# Patient Record
Sex: Female | Born: 1935 | Race: White | Hispanic: No | Smoking: Never smoker
Health system: Southern US, Community
[De-identification: ages and names within clinical notes are randomized; demographics above are authoritative.]

## PROBLEM LIST (undated history)

## (undated) DIAGNOSIS — I219 Acute myocardial infarction, unspecified: Secondary | ICD-10-CM

## (undated) DIAGNOSIS — H6690 Otitis media, unspecified, unspecified ear: Secondary | ICD-10-CM

## (undated) DIAGNOSIS — E871 Hypo-osmolality and hyponatremia: Secondary | ICD-10-CM

## (undated) DIAGNOSIS — R188 Other ascites: Secondary | ICD-10-CM

## (undated) DIAGNOSIS — J449 Chronic obstructive pulmonary disease, unspecified: Secondary | ICD-10-CM

## (undated) DIAGNOSIS — J040 Acute laryngitis: Secondary | ICD-10-CM

## (undated) DIAGNOSIS — I10 Essential (primary) hypertension: Secondary | ICD-10-CM

## (undated) DIAGNOSIS — R54 Age-related physical debility: Secondary | ICD-10-CM

## (undated) DIAGNOSIS — M79672 Pain in left foot: Secondary | ICD-10-CM

## (undated) DIAGNOSIS — J90 Pleural effusion, not elsewhere classified: Secondary | ICD-10-CM

## (undated) DIAGNOSIS — I2510 Atherosclerotic heart disease of native coronary artery without angina pectoris: Secondary | ICD-10-CM

## (undated) DIAGNOSIS — R0600 Dyspnea, unspecified: Secondary | ICD-10-CM

## (undated) DIAGNOSIS — E7800 Pure hypercholesterolemia, unspecified: Secondary | ICD-10-CM

## (undated) DIAGNOSIS — I259 Chronic ischemic heart disease, unspecified: Secondary | ICD-10-CM

## (undated) DIAGNOSIS — N63 Unspecified lump in unspecified breast: Secondary | ICD-10-CM

## (undated) HISTORY — PX: HERNIA REPAIR: SHX51

## (undated) HISTORY — DX: Chronic ischemic heart disease, unspecified: I25.9

## (undated) HISTORY — DX: Pain in left foot: M79.672

## (undated) HISTORY — DX: Hypo-osmolality and hyponatremia: E87.1

## (undated) HISTORY — DX: Unspecified lump in unspecified breast: N63.0

## (undated) HISTORY — DX: Atherosclerotic heart disease of native coronary artery without angina pectoris: I25.10

## (undated) HISTORY — DX: Chronic obstructive pulmonary disease, unspecified: J44.9

## (undated) HISTORY — DX: Dyspnea, unspecified: R06.00

## (undated) HISTORY — DX: Age-related physical debility: R54

## (undated) HISTORY — DX: Acute myocardial infarction, unspecified: I21.9

## (undated) HISTORY — DX: Other ascites: R18.8

## (undated) HISTORY — PX: SPLENECTOMY: SUR1306

## (undated) HISTORY — DX: Otitis media, unspecified, unspecified ear: H66.90

## (undated) HISTORY — DX: Pleural effusion, not elsewhere classified: J90

## (undated) HISTORY — DX: Acute laryngitis: J04.0

## (undated) HISTORY — DX: Pure hypercholesterolemia, unspecified: E78.00

## (undated) HISTORY — DX: Essential (primary) hypertension: I10

---

## 1998-02-17 ENCOUNTER — Other Ambulatory Visit: Admission: RE | Admit: 1998-02-17 | Discharge: 1998-02-17 | Payer: Self-pay | Admitting: *Deleted

## 1998-03-09 ENCOUNTER — Ambulatory Visit (HOSPITAL_BASED_OUTPATIENT_CLINIC_OR_DEPARTMENT_OTHER): Admission: RE | Admit: 1998-03-09 | Discharge: 1998-03-09 | Payer: Self-pay | Admitting: Surgery

## 2000-05-18 ENCOUNTER — Encounter: Payer: Self-pay | Admitting: *Deleted

## 2000-05-18 ENCOUNTER — Encounter: Admission: RE | Admit: 2000-05-18 | Discharge: 2000-05-18 | Payer: Self-pay | Admitting: *Deleted

## 2000-10-16 ENCOUNTER — Encounter: Payer: Self-pay | Admitting: Surgery

## 2000-10-18 ENCOUNTER — Inpatient Hospital Stay (HOSPITAL_COMMUNITY): Admission: EM | Admit: 2000-10-18 | Discharge: 2000-10-22 | Payer: Self-pay | Admitting: Surgery

## 2001-03-07 ENCOUNTER — Encounter: Payer: Self-pay | Admitting: Surgery

## 2001-03-07 ENCOUNTER — Encounter: Admission: RE | Admit: 2001-03-07 | Discharge: 2001-03-07 | Payer: Self-pay | Admitting: Surgery

## 2002-03-10 ENCOUNTER — Encounter: Admission: RE | Admit: 2002-03-10 | Discharge: 2002-03-10 | Payer: Self-pay | Admitting: Surgery

## 2002-03-10 ENCOUNTER — Encounter: Payer: Self-pay | Admitting: Surgery

## 2002-07-08 ENCOUNTER — Encounter: Payer: Self-pay | Admitting: Internal Medicine

## 2002-07-08 ENCOUNTER — Encounter: Admission: RE | Admit: 2002-07-08 | Discharge: 2002-07-08 | Payer: Self-pay | Admitting: Internal Medicine

## 2003-11-16 ENCOUNTER — Encounter: Admission: RE | Admit: 2003-11-16 | Discharge: 2003-11-16 | Payer: Self-pay | Admitting: Internal Medicine

## 2004-01-27 ENCOUNTER — Encounter: Admission: RE | Admit: 2004-01-27 | Discharge: 2004-01-27 | Payer: Self-pay | Admitting: Internal Medicine

## 2004-03-03 ENCOUNTER — Ambulatory Visit (HOSPITAL_COMMUNITY): Admission: RE | Admit: 2004-03-03 | Discharge: 2004-03-03 | Payer: Self-pay | Admitting: Neurology

## 2004-06-15 ENCOUNTER — Encounter: Admission: RE | Admit: 2004-06-15 | Discharge: 2004-06-15 | Payer: Self-pay | Admitting: Internal Medicine

## 2004-08-11 ENCOUNTER — Ambulatory Visit: Payer: Self-pay | Admitting: Internal Medicine

## 2004-08-11 ENCOUNTER — Inpatient Hospital Stay (HOSPITAL_COMMUNITY): Admission: EM | Admit: 2004-08-11 | Discharge: 2004-08-14 | Payer: Self-pay | Admitting: Emergency Medicine

## 2004-08-12 ENCOUNTER — Encounter: Payer: Self-pay | Admitting: Internal Medicine

## 2004-08-25 ENCOUNTER — Encounter: Admission: RE | Admit: 2004-08-25 | Discharge: 2004-08-25 | Payer: Self-pay | Admitting: Surgery

## 2005-08-16 ENCOUNTER — Ambulatory Visit (HOSPITAL_BASED_OUTPATIENT_CLINIC_OR_DEPARTMENT_OTHER): Admission: RE | Admit: 2005-08-16 | Discharge: 2005-08-16 | Payer: Self-pay | Admitting: Surgery

## 2005-08-16 ENCOUNTER — Encounter (INDEPENDENT_AMBULATORY_CARE_PROVIDER_SITE_OTHER): Payer: Self-pay | Admitting: Specialist

## 2005-11-10 ENCOUNTER — Encounter: Admission: RE | Admit: 2005-11-10 | Discharge: 2005-11-10 | Payer: Self-pay | Admitting: Internal Medicine

## 2006-10-09 DIAGNOSIS — I219 Acute myocardial infarction, unspecified: Secondary | ICD-10-CM

## 2006-10-09 HISTORY — DX: Acute myocardial infarction, unspecified: I21.9

## 2006-12-06 ENCOUNTER — Inpatient Hospital Stay (HOSPITAL_COMMUNITY): Admission: EM | Admit: 2006-12-06 | Discharge: 2006-12-18 | Payer: Self-pay | Admitting: Emergency Medicine

## 2006-12-06 ENCOUNTER — Encounter (INDEPENDENT_AMBULATORY_CARE_PROVIDER_SITE_OTHER): Payer: Self-pay | Admitting: Cardiology

## 2006-12-15 ENCOUNTER — Encounter (INDEPENDENT_AMBULATORY_CARE_PROVIDER_SITE_OTHER): Payer: Self-pay | Admitting: Specialist

## 2008-04-22 ENCOUNTER — Encounter: Admission: RE | Admit: 2008-04-22 | Discharge: 2008-04-22 | Payer: Self-pay | Admitting: Otolaryngology

## 2009-10-23 ENCOUNTER — Inpatient Hospital Stay (HOSPITAL_COMMUNITY): Admission: EM | Admit: 2009-10-23 | Discharge: 2009-10-25 | Payer: Self-pay | Admitting: Emergency Medicine

## 2010-05-23 ENCOUNTER — Ambulatory Visit: Payer: Self-pay | Admitting: Cardiology

## 2010-08-24 ENCOUNTER — Ambulatory Visit: Payer: Self-pay | Admitting: Cardiology

## 2010-12-06 ENCOUNTER — Ambulatory Visit (INDEPENDENT_AMBULATORY_CARE_PROVIDER_SITE_OTHER): Payer: Medicare Other | Admitting: Cardiology

## 2010-12-06 ENCOUNTER — Other Ambulatory Visit: Payer: Self-pay | Admitting: Cardiology

## 2010-12-06 DIAGNOSIS — E7800 Pure hypercholesterolemia, unspecified: Secondary | ICD-10-CM

## 2010-12-06 DIAGNOSIS — I119 Hypertensive heart disease without heart failure: Secondary | ICD-10-CM

## 2010-12-06 DIAGNOSIS — Z79899 Other long term (current) drug therapy: Secondary | ICD-10-CM

## 2010-12-07 ENCOUNTER — Ambulatory Visit: Payer: Self-pay | Admitting: Cardiology

## 2010-12-08 ENCOUNTER — Ambulatory Visit
Admission: RE | Admit: 2010-12-08 | Discharge: 2010-12-08 | Disposition: A | Discharge status: Home / Self Care | Payer: PRIVATE HEALTH INSURANCE | Source: Ambulatory Visit | Attending: Cardiology | Admitting: Cardiology

## 2010-12-08 ENCOUNTER — Ambulatory Visit
Admission: RE | Admit: 2010-12-08 | Discharge: 2010-12-08 | Disposition: A | Discharge status: Home / Self Care | Payer: Medicare Other | Source: Ambulatory Visit | Attending: Cardiology | Admitting: Cardiology

## 2010-12-16 ENCOUNTER — Emergency Department (HOSPITAL_COMMUNITY): Payer: Medicare Other

## 2010-12-16 ENCOUNTER — Inpatient Hospital Stay (HOSPITAL_COMMUNITY)
Admission: EM | Admit: 2010-12-16 | Discharge: 2010-12-22 | DRG: 920 | Disposition: A | Discharge status: Home Health | Payer: Medicare Other | Attending: Internal Medicine | Admitting: Internal Medicine

## 2010-12-16 DIAGNOSIS — H6690 Otitis media, unspecified, unspecified ear: Secondary | ICD-10-CM | POA: Diagnosis present

## 2010-12-16 DIAGNOSIS — E236 Other disorders of pituitary gland: Secondary | ICD-10-CM | POA: Diagnosis present

## 2010-12-16 DIAGNOSIS — M79609 Pain in unspecified limb: Secondary | ICD-10-CM | POA: Diagnosis present

## 2010-12-16 DIAGNOSIS — J90 Pleural effusion, not elsewhere classified: Secondary | ICD-10-CM | POA: Diagnosis present

## 2010-12-16 DIAGNOSIS — I252 Old myocardial infarction: Secondary | ICD-10-CM

## 2010-12-16 DIAGNOSIS — Y838 Other surgical procedures as the cause of abnormal reaction of the patient, or of later complication, without mention of misadventure at the time of the procedure: Secondary | ICD-10-CM | POA: Diagnosis present

## 2010-12-16 DIAGNOSIS — R5381 Other malaise: Secondary | ICD-10-CM | POA: Diagnosis present

## 2010-12-16 DIAGNOSIS — Z9089 Acquired absence of other organs: Secondary | ICD-10-CM

## 2010-12-16 DIAGNOSIS — IMO0002 Reserved for concepts with insufficient information to code with codable children: Principal | ICD-10-CM | POA: Diagnosis present

## 2010-12-16 DIAGNOSIS — I10 Essential (primary) hypertension: Secondary | ICD-10-CM | POA: Diagnosis present

## 2010-12-16 LAB — DIFFERENTIAL
Basophils Absolute: 0 10*3/uL (ref 0.0–0.1)
Lymphocytes Relative: 16 % (ref 12–46)
Lymphs Abs: 2.1 10*3/uL (ref 0.7–4.0)
Neutro Abs: 10.2 10*3/uL — ABNORMAL HIGH (ref 1.7–7.7)
Neutrophils Relative %: 78 % — ABNORMAL HIGH (ref 43–77)

## 2010-12-16 LAB — CBC
HCT: 37.4 % (ref 36.0–46.0)
MCV: 87.4 fL (ref 78.0–100.0)
RBC: 4.28 MIL/uL (ref 3.87–5.11)
RDW: 12.3 % (ref 11.5–15.5)
WBC: 13 10*3/uL — ABNORMAL HIGH (ref 4.0–10.5)

## 2010-12-16 LAB — COMPREHENSIVE METABOLIC PANEL
ALT: 16 U/L (ref 0–35)
Albumin: 3.2 g/dL — ABNORMAL LOW (ref 3.5–5.2)
Alkaline Phosphatase: 96 U/L (ref 39–117)
BUN: 8 mg/dL (ref 6–23)
Chloride: 87 mEq/L — ABNORMAL LOW (ref 96–112)
Glucose, Bld: 166 mg/dL — ABNORMAL HIGH (ref 70–99)
Potassium: 3.8 mEq/L (ref 3.5–5.1)
Sodium: 120 mEq/L — ABNORMAL LOW (ref 135–145)
Total Bilirubin: 0.8 mg/dL (ref 0.3–1.2)
Total Protein: 7.2 g/dL (ref 6.0–8.3)

## 2010-12-16 LAB — URINALYSIS, ROUTINE W REFLEX MICROSCOPIC
Bilirubin Urine: NEGATIVE
Glucose, UA: 100 mg/dL — AB
Ketones, ur: 40 mg/dL — AB
Protein, ur: NEGATIVE mg/dL
pH: 7.5 (ref 5.0–8.0)

## 2010-12-16 LAB — LIPASE, BLOOD: Lipase: 25 U/L (ref 11–59)

## 2010-12-16 LAB — URINE MICROSCOPIC-ADD ON

## 2010-12-17 ENCOUNTER — Inpatient Hospital Stay (HOSPITAL_COMMUNITY): Payer: Medicare Other

## 2010-12-17 LAB — COMPREHENSIVE METABOLIC PANEL
ALT: 20 U/L (ref 0–35)
AST: 24 U/L (ref 0–37)
Albumin: 2.8 g/dL — ABNORMAL LOW (ref 3.5–5.2)
CO2: 22 mEq/L (ref 19–32)
Calcium: 7.8 mg/dL — ABNORMAL LOW (ref 8.4–10.5)
Creatinine, Ser: 0.62 mg/dL (ref 0.4–1.2)
GFR calc Af Amer: >60 mL/min (ref >60)
GFR calc non Af Amer: >60 mL/min (ref >60)
Sodium: 119 mEq/L — CL (ref 135–145)
Total Protein: 6.1 g/dL (ref 6.0–8.3)

## 2010-12-17 LAB — SODIUM
Sodium: 121 mEq/L — ABNORMAL LOW (ref 135–145)
Sodium: 120 mEq/L — ABNORMAL LOW (ref 135–145)

## 2010-12-17 LAB — LIPID PANEL
Cholesterol: 155 mg/dL (ref 0–200)
Triglycerides: 95 mg/dL (ref <150)

## 2010-12-17 LAB — GLUCOSE, CAPILLARY
Glucose-Capillary: 138 mg/dL — ABNORMAL HIGH (ref 70–99)
Glucose-Capillary: 108 mg/dL — ABNORMAL HIGH (ref 70–99)
Glucose-Capillary: 127 mg/dL — ABNORMAL HIGH (ref 70–99)

## 2010-12-17 LAB — OSMOLALITY, URINE: Osmolality, Ur: 467 mOsm/kg (ref 390–1090)

## 2010-12-17 LAB — CBC
MCH: 29.1 pg (ref 26.0–34.0)
MCHC: 33.7 g/dL (ref 30.0–36.0)
Platelets: 272 10*3/uL (ref 150–400)
RDW: 12.2 % (ref 11.5–15.5)

## 2010-12-17 LAB — PROTIME-INR
INR: 1.02 (ref 0.00–1.49)
Prothrombin Time: 13.6 seconds (ref 11.6–15.2)

## 2010-12-17 LAB — VITAMIN B12: Vitamin B-12: 483 pg/mL (ref 211–911)

## 2010-12-17 LAB — BRAIN NATRIURETIC PEPTIDE: Pro B Natriuretic peptide (BNP): 416 pg/mL — ABNORMAL HIGH (ref 0.0–100.0)

## 2010-12-17 MED ORDER — IOHEXOL 300 MG/ML  SOLN
125.0000 mL | Freq: Once | INTRAMUSCULAR | Status: AC | PRN
  Administered 2010-12-17: 125 mL via INTRAVENOUS

## 2010-12-17 NOTE — H&P (Signed)
NAME:  [name], [name]               ACCOUNT NO.:  [id number]  MEDICAL RECORD NO.:  [id number]           PATIENT TYPE:  E  LOCATION:  WLED                         FACILITY:  [facility]  PHYSICIAN:  Conley Canal, MD      DATE OF BIRTH:  1916-09-07  DATE OF ADMISSION:  12/16/2010 DATE OF DISCHARGE:                             HISTORY & PHYSICAL   PRIMARY CARE PHYSICIAN:  Dr. Patty Sermons.  CHIEF COMPLAINT:  Not feeling well, weak, abdominal pain for a few days.  HISTORY OF PRESENT ILLNESS:  [name] is a 77 year old female with history of hypertension, myocardial infarction in the past, laryngitis, status post splenectomy, who comes in with complaints of feeling weak for the last few days.  She has had some cough and abdominal soreness, which she attributed to the recurrent cough.  She denied any fever.  No diarrhea.  No urinary symptoms.  In the emergency room, the patient had a CT abdomen and pelvis, which showed a large fluid collection beneath the surgical hernia mesh and also some bronchiectasis in the lower lobes.  Surgery was consulted by the emergency room, and he recommended interventional radiology guided drainage.  PAST MEDICAL HISTORY: 1. Hypertension. 2. History of myocardial infarction. 3. Pneumonia. 4. Laryngitis. 5. Splenectomy.  SOCIAL HISTORY:  Denies cigarette smoking, alcohol, or illicit drugs. The patient lives alone.  FAMILY HISTORY:  Noncontributory at this point.  ALLERGIES:  NEOSPORIN, PENICILLINS, PREDNISONE, SULFA.  HOME MEDICATIONS:  Lopressor, Lasix, aspirin, multivitamins, calcium, azithromycin, losartan, Vigamox, Mucinex.  REVIEW OF SYSTEMS:  Limited as the patient is hard of hearing, otherwise she denied any other symptomatology except as highlighted above.  PHYSICAL EXAMINATION:  GENERAL:  This is a frail elderly lady, who is accompanied by family members at bedside.  VITAL SIGNS:  Blood pressure 169/73, heart rate is 83, temperature 98,  respirations 20, oxygen saturation is 95% on room air. HEAD, EARS, NOSE, AND THROAT:  No oral thrush.  No jugular venous distention.  RESPIRATORY SYSTEM:  Bilateral air entry, scattered rhonchi.  No wheezing.  CARDIOVASCULAR SYSTEM:  First and second heart sounds heard.  No murmurs.  Pulse regular. ABDOMEN:  Slightly distended, diffusely tender to deep palpation in the epigastric region.  Bowel sounds appreciated. CNS:  The patient is hard of hearing, follows commands.  No localized deficits.  EXTREMITIES:  No pedal edema.  Peripheral pulses equal.  Labs were reviewed.  Significant for WBC 13, hemoglobin 12.6, hematocrit 37.4, platelet count 294, neutrophils 78%.  Sodium is 120, potassium 3.8, BUN 8, creatinine 0.67, glucose 167, calcium 8.6, lipase 25. Urinalysis:  Nitrites negative, leukocytes trace.  CT abdomen and pelvis was described above.  IMPRESSION:  A 77 year old female presented with some abdominal pain and has abdominal fluid collection concerning for abscess collection in view of leukocytosis and general lethargy.  She is hyponatremic, most likely related to ongoing abdominal issues as well as laryngitis.  PLAN: 1. Abdominal fluid collection, presumed abscess collection.  We will     admit the patient for IV antibiotics and arrange for interventional     radiology guided drainage, to send fluid for  Gram stain culture and     sensitivity.  Consider surgical input.  The patient will be covered     with vancomycin and clindamycin given PENICILLIN allergy. 2. Hyponatremia.  There might be element of dehydration given the     abdominal fluid collection.  We will send urine osmolality, sodium,     also serum osmolality, uric acid level, TSH, lipids panel, BNP 3. Hypertension.  Blood pressure uncontrolled.  We will resume     Lopressor, losartan.  Hold Lasix had Norvasc. 4. GI prophylaxis, PPI. 5. DVT prophylaxis, SCDs until fluid drained. 6. The patient's condition is  closely guarded.     Conley Canal, MD     SR/MEDQ  D:  12/17/2010  T:  12/17/2010  Job:  [id number]  cc:   Dr. Patty Sermons  Electronically Signed by Conley Canal  on 12/17/2010 05:08:41 AM

## 2010-12-18 LAB — BASIC METABOLIC PANEL
BUN: 6 mg/dL (ref 6–23)
CO2: 23 mEq/L (ref 19–32)
Calcium: 7.9 mg/dL — ABNORMAL LOW (ref 8.4–10.5)
Chloride: 93 mEq/L — ABNORMAL LOW (ref 96–112)
Creatinine, Ser: 0.77 mg/dL (ref 0.4–1.2)
GFR calc Af Amer: >60 mL/min (ref >60)
GFR calc non Af Amer: >60 mL/min (ref >60)
Glucose, Bld: 102 mg/dL — ABNORMAL HIGH (ref 70–99)
Potassium: 3.8 mEq/L (ref 3.5–5.1)
Sodium: 121 mEq/L — ABNORMAL LOW (ref 135–145)

## 2010-12-18 LAB — URINE CULTURE
Colony Count: >=100000
Special Requests: NEGATIVE

## 2010-12-18 LAB — GLUCOSE, CAPILLARY
Glucose-Capillary: 106 mg/dL — ABNORMAL HIGH (ref 70–99)
Glucose-Capillary: 100 mg/dL — ABNORMAL HIGH (ref 70–99)
Glucose-Capillary: 112 mg/dL — ABNORMAL HIGH (ref 70–99)

## 2010-12-19 ENCOUNTER — Inpatient Hospital Stay (HOSPITAL_COMMUNITY): Payer: Medicare Other

## 2010-12-19 LAB — CBC
HCT: 35.1 % — ABNORMAL LOW (ref 36.0–46.0)
MCHC: 33.3 g/dL (ref 30.0–36.0)
Platelets: 297 10*3/uL (ref 150–400)
RDW: 12.4 % (ref 11.5–15.5)
WBC: 12.2 10*3/uL — ABNORMAL HIGH (ref 4.0–10.5)

## 2010-12-19 LAB — BASIC METABOLIC PANEL
Calcium: 8.1 mg/dL — ABNORMAL LOW (ref 8.4–10.5)
GFR calc Af Amer: >60 mL/min (ref >60)
GFR calc non Af Amer: >60 mL/min (ref >60)
Glucose, Bld: 100 mg/dL — ABNORMAL HIGH (ref 70–99)
Potassium: 3.4 mEq/L — ABNORMAL LOW (ref 3.5–5.1)
Sodium: 126 mEq/L — ABNORMAL LOW (ref 135–145)

## 2010-12-19 LAB — GLUCOSE, CAPILLARY: Glucose-Capillary: 182 mg/dL — ABNORMAL HIGH (ref 70–99)

## 2010-12-20 ENCOUNTER — Inpatient Hospital Stay (HOSPITAL_COMMUNITY): Payer: Medicare Other

## 2010-12-20 LAB — URINE CULTURE
Colony Count: NO GROWTH
Culture  Setup Time: 201203112042
Culture: NO GROWTH
Culture: NO GROWTH
Special Requests: NEGATIVE

## 2010-12-20 LAB — GLUCOSE, CAPILLARY
Glucose-Capillary: 131 mg/dL — ABNORMAL HIGH (ref 70–99)
Glucose-Capillary: 151 mg/dL — ABNORMAL HIGH (ref 70–99)

## 2010-12-20 LAB — CBC
Hemoglobin: 11.6 g/dL — ABNORMAL LOW (ref 12.0–15.0)
MCH: 29.4 pg (ref 26.0–34.0)
MCHC: 33.1 g/dL (ref 30.0–36.0)
Platelets: 309 10*3/uL (ref 150–400)
RDW: 12.6 % (ref 11.5–15.5)

## 2010-12-20 LAB — BASIC METABOLIC PANEL
BUN: 11 mg/dL (ref 6–23)
Creatinine, Ser: 0.74 mg/dL (ref 0.4–1.2)
GFR calc non Af Amer: >60 mL/min (ref >60)
Glucose, Bld: 109 mg/dL — ABNORMAL HIGH (ref 70–99)
Potassium: 3.8 mEq/L (ref 3.5–5.1)

## 2010-12-20 LAB — PROTIME-INR: Prothrombin Time: 14.1 seconds (ref 11.6–15.2)

## 2010-12-21 ENCOUNTER — Inpatient Hospital Stay (HOSPITAL_COMMUNITY): Payer: Medicare Other

## 2010-12-21 LAB — CBC
Hemoglobin: 12 g/dL (ref 12.0–15.0)
MCH: 29.4 pg (ref 26.0–34.0)
RBC: 4.08 MIL/uL (ref 3.87–5.11)

## 2010-12-21 LAB — BASIC METABOLIC PANEL
CO2: 27 mEq/L (ref 19–32)
Chloride: 98 mEq/L (ref 96–112)
GFR calc Af Amer: >60 mL/min (ref >60)
Sodium: 131 mEq/L — ABNORMAL LOW (ref 135–145)

## 2010-12-21 LAB — GLUCOSE, CAPILLARY
Glucose-Capillary: 102 mg/dL — ABNORMAL HIGH (ref 70–99)
Glucose-Capillary: 117 mg/dL — ABNORMAL HIGH (ref 70–99)

## 2010-12-21 MED ORDER — IOHEXOL 300 MG/ML  SOLN
100.0000 mL | Freq: Once | INTRAMUSCULAR | Status: AC | PRN
  Administered 2010-12-21: 100 mL via INTRAVENOUS

## 2010-12-22 ENCOUNTER — Inpatient Hospital Stay (HOSPITAL_COMMUNITY): Payer: Medicare Other

## 2010-12-22 LAB — CBC
HCT: 37.3 % (ref 36.0–46.0)
MCH: 29.2 pg (ref 26.0–34.0)
MCHC: 33 g/dL (ref 30.0–36.0)
MCV: 88.6 fL (ref 78.0–100.0)
RDW: 13 % (ref 11.5–15.5)

## 2010-12-23 LAB — CULTURE, BLOOD (ROUTINE X 2)
Culture  Setup Time: 201203101159
Culture: NO GROWTH

## 2010-12-23 LAB — CULTURE, BLOOD (SINGLE): Culture: NO GROWTH

## 2010-12-24 LAB — DIFFERENTIAL
Basophils Absolute: 0 10*3/uL (ref 0.0–0.1)
Basophils Relative: 0 % (ref 0–1)
Lymphs Abs: 1.3 10*3/uL (ref 0.7–4.0)
Monocytes Relative: 4 % (ref 3–12)
Neutro Abs: 10.2 10*3/uL — ABNORMAL HIGH (ref 1.7–7.7)

## 2010-12-24 LAB — CBC
HCT: 42.7 % (ref 36.0–46.0)
Hemoglobin: 13.9 g/dL (ref 12.0–15.0)
MCV: 92.6 fL (ref 78.0–100.0)
Platelets: 271 10*3/uL (ref 150–400)
RDW: 13 % (ref 11.5–15.5)
WBC: 12 10*3/uL — ABNORMAL HIGH (ref 4.0–10.5)

## 2010-12-24 LAB — URINALYSIS, ROUTINE W REFLEX MICROSCOPIC
Bilirubin Urine: NEGATIVE
Glucose, UA: NEGATIVE mg/dL
pH: 8 (ref 5.0–8.0)

## 2010-12-24 LAB — BASIC METABOLIC PANEL
BUN: 10 mg/dL (ref 6–23)
Chloride: 98 mEq/L (ref 96–112)
Glucose, Bld: 127 mg/dL — ABNORMAL HIGH (ref 70–99)
Potassium: 3.9 mEq/L (ref 3.5–5.1)

## 2010-12-24 NOTE — Discharge Summary (Signed)
NAME:  [name], [name]               ACCOUNT NO.:  [id number]  MEDICAL RECORD NO.:  [id number]           PATIENT TYPE:  I  LOCATION:  1517                         [facility]ACILITY:  [facility]  PHYSICIAN:  Thad Ranger, MD       DATE OF BIRTH:  27-Jan-1916  DATE OF ADMISSION:  12/16/2010 DATE OF DISCHARGE:                              DISCHARGE SUMMARY   PRIMARY CARE PHYSICIAN:  Cassell Clement, M.D.  DISCHARGE DIAGNOSES: 1. Abdominal fluid collection, likely seroma, evaluated by     Interventional Radiology. 2. Hyponatremia, improved. 3. Hypertension. 4. Bilateral pleural effusions with  subtle infiltrate, left base. 5. Generalized deconditioning. 6. Left foot pain. 7. Left otitis media.  DISCHARGE MEDICATIONS: 1. Flonase 50 mcg nasal spray, 1 spray nasally twice daily. 2. Vicodin 5/325 mg p.o. every 6 hours as needed for pain. 3. Levofloxacin 500 mg p.o. daily for 7 days. 4. Lasix 40 mg p.o. daily until follow up with Dr. Patty Sermons, then  adjust dose. 5. Aspirin 81 mg p.o. daily. 6. Calcium 600 mg p.o. daily. 7. Losartan 100 mg p.o. q.p.m. 8. Toprol XL 25 mg p.o. q.a.m. 9. Mucinex XR 600 mg p.o. b.i.d. 10.Multivitamin 1 tablet daily. 11.Vigamox ophthalmic 0.5%, 1 drop in left eye 4 times daily.  CONSULTATIONS:  Interventional Radiology.  BRIEF HISTORY OF PRESENT ILLNESS:  At the time of admission, Ms. Kemnitz is a 77 year old female with hypertension, history of coronary disease, laryngitis, status post splenectomy, presented with feeling of weakness for the last few days.  Prior to admission, she had some cough and abdominal fullness which she had attributed to recurrent cough.  In the emergency room, the patient had a CT of abdomen and pelvis which showed a large fluid collection beneath the surgical hernia mesh and also some bronchiectasis in the lower lobes.  The patient was admitted for further workup.  For details, refer to the admission note dictated by Dr. Conley Canal on  December 17, 2010.  RADIOLOGICAL DATA:  CT of abdomen and pelvis, this acute abdominal series on December 16, 2010 showed unremarkable bowel gas pattern, interstitial prominence and mild peribronchial thickening, suspect chronic.  CT of abdomen and pelvis on  March 10 showed large fluid collection beneath the surgical hernia mesh, may represent sterile collection versus abscess, bronchiectasis in the lower lobe, splenectomy.  Chest x-ray 2-view on December 17, 2010, showed cardiomegaly with bibasilar atelectasis versus infiltrate, small effusion, emphysematous changes.  CT of head without contrast on March 12 showed chronic small vessel ischemia.  No acute intracranial abnormality, scattered paranasal sinus and left mastoid air cell, opacification, mastoid findings most commonly reflect a sterile effusion, possibility of left otitis media.  Chest x-ray 2-view March 12, some increase in the small bilateral pleural effusion, bibasilar airspace opacity, likely due to atelectasis, cardiomegaly without edema.  CT neck with contrast showed no acute or significant findings on neck.  No evidence of mucosal or submucosal lesions.  No cause of pain.  CT chest with contrast, March 14, motion degraded exam, bilateral slightly moderate-sized pleural effusion, adjacent parenchymal changes suggestive of passive atelectasis, although difficult to exclude a subtle infiltrate  within the left base.  Mildly enlarged right hilar lymph nodes, possible reactive or mild CHF, cardiomegaly, very small pericardial effusion, coronary artery calcification, calcification of the aorta, asymmetric appearance of the breast tissue with fullness on the left.  BRIEF HOSPITALIZATION COURSE:  Ms. Stiner is a 77 year old female who presented with some abdominal pain and cough and also was found to have some abdominal fluid collection concerning for abscess. 1. Abdominal fluid collection.  Surgery was consulted in the emergency      room and recommended interventional radiology evaluation.  IR     evaluated the patient and felt given the patient did not have     abdominal symptoms and most likely the fluid is a seroma and not     infected and did not recommend drainage at this time. 2. Mild dyspnea with left lung infiltrate and leukocytosis and left     posterior otitis media.  CT chest and neck was done yesterday which     did show some pleural effusion as well as subtle infiltrates within     the left base.  The patient has been on antibiotics, vancomycin and     clindamycin, day 6 today.  The patient will be discharged on     levofloxacin to complete the course. 3. Left foot pain, unclear in etiology.  The patient had off and on     left foot pain.  However, improved on ambulation and I will obtain     foot x-ray.  The patient had adamantly refused it in the last 2     days.  However, she is agreeable to do it today.  Pending the     results, the patient will be discharged home. 4. Generalized deconditioning.  Home PT/OT will be arranged at the     time of discharge as well as home RN for followup. 5. Pleural effusion.  During the hospitalization, Lasix was held and     the patient also received IV fluids and she was placed back on     Lasix and I also gave her 1 extra dose of Lasix.  She will continue     40 mg of Lasix daily until she follows up with Dr. Patty Sermons within     next 2 weeks.  She should have a BMET checked at Dr. Yevonne Pax     office.  PHYSICAL EXAMINATION ON DISCHARGE:  VITAL SIGNS:  At the time of the dictation, temperature 98.3, pulse 80, respirations 20, blood pressure 120/66, O2 sats 94% on room air. GENERAL:  The patient is alert, awake, not in any acute distress. HEENT:  Anicteric sclerae.  Conjunctivae clear.  Pupils react to light and accommodation.  EOMI. NECK:  Supple.  No lymphadenopathy. CVS:  S1 and S2 clear. CHEST:  Decreased breath sounds at the bases, otherwise fairly  clear. ABDOMEN:  Soft, nontender, nondistended.  Normal bowel sounds. EXTREMITIES:  No cyanosis, clubbing or edema in the legs.  Left foot slightly erythematous and swollen.  RECOMMENDATIONS:  Left foot x-ray pending at the time of dictation. Discharge followup with Dr. Cassell Clement within next 2 weeks. Discharge time 35 minutes.     Thad Ranger, MD     RR/MEDQ  D:  12/22/2010  T:  12/22/2010  Job:  [id number]  cc:   Cassell Clement, M.D. Fax: [phone]  Electronically Signed by Darrion Macaulay  on 12/24/2010 11:36:38 AM

## 2010-12-25 LAB — CBC
HCT: 36.1 % (ref 36.0–46.0)
Hemoglobin: 12 g/dL (ref 12.0–15.0)
MCHC: 33.1 g/dL (ref 30.0–36.0)
MCV: 91.9 fL (ref 78.0–100.0)
Platelets: 243 10*3/uL (ref 150–400)
RDW: 13.1 % (ref 11.5–15.5)

## 2010-12-25 LAB — BASIC METABOLIC PANEL
BUN: 13 mg/dL (ref 6–23)
CO2: 24 mEq/L (ref 19–32)
GFR calc non Af Amer: 57 mL/min — ABNORMAL LOW (ref >60)
Glucose, Bld: 91 mg/dL (ref 70–99)
Potassium: 3.9 mEq/L (ref 3.5–5.1)

## 2010-12-25 LAB — LIPID PANEL
Total CHOL/HDL Ratio: 3.2 RATIO
VLDL: 25 mg/dL (ref 0–40)

## 2010-12-27 LAB — CULTURE, BLOOD (ROUTINE X 2)
Culture  Setup Time: 201203141346
Culture: NO GROWTH

## 2010-12-29 ENCOUNTER — Encounter: Payer: Self-pay | Admitting: Cardiology

## 2010-12-30 ENCOUNTER — Telehealth: Payer: Self-pay | Admitting: *Deleted

## 2010-12-30 NOTE — Telephone Encounter (Signed)
[name], physical therapist with advanced left message on voice mail.  Patient refused physical therapy and daughter said mother back to her norm.

## 2011-01-05 ENCOUNTER — Encounter: Payer: Self-pay | Admitting: Cardiology

## 2011-01-05 ENCOUNTER — Ambulatory Visit (INDEPENDENT_AMBULATORY_CARE_PROVIDER_SITE_OTHER): Payer: Medicare Other | Admitting: Cardiology

## 2011-01-05 VITALS — BP 104/70 | HR 80 | Wt 141.0 lb

## 2011-01-05 DIAGNOSIS — E7800 Pure hypercholesterolemia, unspecified: Secondary | ICD-10-CM

## 2011-01-05 DIAGNOSIS — Z9089 Acquired absence of other organs: Secondary | ICD-10-CM

## 2011-01-05 DIAGNOSIS — Z9081 Acquired absence of spleen: Secondary | ICD-10-CM | POA: Insufficient documentation

## 2011-01-05 DIAGNOSIS — E871 Hypo-osmolality and hyponatremia: Secondary | ICD-10-CM

## 2011-01-05 DIAGNOSIS — I10 Essential (primary) hypertension: Secondary | ICD-10-CM

## 2011-01-05 LAB — BASIC METABOLIC PANEL
CO2: 28 mEq/L (ref 19–32)
Chloride: 99 mEq/L (ref 96–112)
Potassium: 3.9 mEq/L (ref 3.5–5.1)
Sodium: 135 mEq/L (ref 135–145)

## 2011-01-05 NOTE — Progress Notes (Signed)
History of Present Illness: This pleasant 77 year old woman is seen for a scheduled followup office visit.  She was recently hospitalized at [facility] long on 12/16/10 for weakness and abdominal pain.  There was a question as to whether she might have to have interventional radiology guided drainage of a possible abdominal abscess but this turned out to be a benign seroma and did not require intervention.  She presently is feeling well.  She's not having any chills or fever or abdominal pain.  She is still having some lingering discomfort in her left ear and has an appointment to see Dr. Haroldine Laws later today  Current Outpatient Prescriptions  Medication Sig Dispense Refill  . aspirin 81 MG tablet Take 81 mg by mouth daily.        . Calcium Carbonate-Vitamin D (CALCIUM 600 + D PO) Take 1 tablet by mouth daily.        . fluticasone (FLONASE) 50 MCG/ACT nasal spray 2 sprays by Nasal route daily.        . furosemide (LASIX) 40 MG tablet Take 20 mg by mouth every other day.        . losartan (COZAAR) 100 MG tablet Take 100 mg by mouth daily.        . metoprolol succinate (TOPROL-XL) 25 MG 24 hr tablet Take 25 mg by mouth daily.        . Multiple Vitamin (MULTIVITAMIN) tablet Take 1 tablet by mouth daily.        . nitroGLYCERIN (NITROSTAT) 0.4 MG SL tablet Place 0.4 mg under the tongue every 5 (five) minutes as needed.          Allergies  Allergen Reactions  . Lipitor (Atorvastatin Calcium)     MUSCLE CRAMPS  . Neosporin (Neomycin-Polymyx-Gramicid)   . Prednisone   . Sulfa Antibiotics   . Penicillins Rash    Patient Active Problem List  Diagnoses  . Hyponatremia  . Hypertension  . Hypercholesterolemia  . Status post splenectomy    History  Smoking status  . Never Smoker   Smokeless tobacco  . Not on file    History  Alcohol Use     No family history on file.  Review of Systems: Constitutional: no fever chills diaphoresis or fatigue or change in weight.  Head and neck: no  hearing loss, no epistaxis, no photophobia or visual disturbance. Respiratory: No cough, shortness of breath or wheezing. Cardiovascular: No chest pain peripheral edema, palpitations. Gastrointestinal: No abdominal distention, no abdominal pain, no change in bowel habits hematochezia or melena. Genitourinary: No dysuria, no frequency, no urgency, no nocturia. Musculoskeletal:No arthralgias, no back pain, no gait disturbance or myalgias. Neurological: No dizziness, no headaches, no numbness, no seizures, no syncope, no weakness, no tremors. Hematologic: No lymphadenopathy, no easy bruising. Psychiatric: No confusion, no hallucinations, no sleep disturbance.    Physical Exam: Filed Vitals:   01/05/11 1005  BP: 104/70  Pulse: 80  Her weight is 141 down 5 pounds.  This is a well-developed well-nourished elderly woman who looks younger than her stated age.Pupils equal and reactive.   Extraocular Movements are full.  There is no scleral icterus.  The mouth and pharynx are normal.  The neck is supple.  The carotids reveal no bruits.  The jugular venous pressure is normal.  The thyroid is not enlarged.  There is no lymphadenopathy.The chest is clear to percussion and auscultation. There are no rales or rhonchi. Expansion of the chest is symmetrical.The precordium is quiet.  The first  heart sound is normal.  The second heart sound is physiologically split.  There is no murmur gallop rub or click.  There is no abnormal lift or heave.The abdomen is soft and nontender. Bowel sounds are normal. The liver and spleen are not enlarged. There Are no abdominal masses. There are no bruits.The pedal pulses are good.  There is no phlebitis or edema.  There is no cyanosis or clubbing.Strength is normal and symmetrical in all extremities.  There is no lateralizing weakness.  There are no sensory deficits.   Assessment / Plan:

## 2011-01-05 NOTE — Assessment & Plan Note (Signed)
The patient was recently hospitalized on 12/16/10 before weakness and abdominal pain.  She was found to be mildly hyponatremic.  Serum sodium was 120.  Her BUN was 8 and her creatinine was 0.67.  In the hospital she had some signs of mild congestive heart failure and was treated with full dose Lasix 40 mg daily.  Her weight is now down 6 pounds and we will cut back on her furosemide to just one half tablet daily

## 2011-01-05 NOTE — Assessment & Plan Note (Signed)
Her blood pressure in the office today is satisfactory.  She denies any dizziness but she states that her head feels heavy.  She has had recent left otitis media with decreased hearing and is scheduled to see Dr. Haroldine Laws later today.

## 2011-01-06 ENCOUNTER — Telehealth: Payer: Self-pay | Admitting: *Deleted

## 2011-01-06 NOTE — Telephone Encounter (Signed)
Message copied by Regis Bill on Fri Jan 06, 2011  2:14 PM ------      Message from: Cassell Clement      Created: Thu Jan 05, 2011  5:00 PM       Serum sodium is now back up to 135 which is normal.  Continue present medication.

## 2011-01-06 NOTE — Progress Notes (Signed)
Advised patient

## 2011-01-06 NOTE — Telephone Encounter (Signed)
Home health nurse phoned, patient blood pressure 126/60, FYI.  Lab results given to patient

## 2011-01-09 ENCOUNTER — Other Ambulatory Visit: Payer: Self-pay | Admitting: Cardiology

## 2011-01-09 DIAGNOSIS — I10 Essential (primary) hypertension: Secondary | ICD-10-CM

## 2011-01-09 DIAGNOSIS — I119 Hypertensive heart disease without heart failure: Secondary | ICD-10-CM

## 2011-02-24 NOTE — Op Note (Signed)
[facility]  Patient:    [name], [name]                      MRN: [id number] Proc. Date: 10/17/00 Adm. Date:  [id number] Attending:  Katha Cabal                           Operative Report  PREOPERATIVE DIAGNOSIS:  Ventral incisional hernia.  POSTOPERATIVE DIAGNOSIS:  Ventral incisional hernia, swiss-cheese lower incisional hernia.  OPERATION:  Laparoscopic ventral hernia repair with Gore-Tex mesh.  SURGEON:  Thornton Park. Daphine Deutscher, M.D.  ASSISTANT:  Angelia Mould. Derrell Lolling, M.D.  ANESTHESIA:  General endotracheal.  DESCRIPTION OF PROCEDURE:  [name] was taken to operating room #1 on the afternoon of October 18, 1999, and given general anesthesia.  The patient was prepped wide open with Betadine and draped sterilely.  I entered on the left side and placed a Hasson cannula first and insufflated the abdomen.  We placed a 5 mm in the right upper quadrant and eventually another 10 mm in the right lower quadrant.  Using the harmonic scalpel to take down numerous adhesions that were stuck up and incarcerated in these multiple big dilated holes where the permanent suture material had torn through the fascia.  These were reduced in toto.  This left multiple holes and what we did was map out a big enough piece of mesh to cover all of these holes with the needles.  This led [place] to cut a piece of mesh about 18 x 24 trimming the edges, marking it and then placing eight stitches all around it.  This was then marked, rolled and inserted through the 10 mm site and then unrolled.  We then grasped each of the Prolene sutures sequentially and got around and found that we had actually skipped one and went back and found that and moved everything forward so that it was properly oriented, and these were pulled up to the upper anterior abdominal wall and tacked.  One of these in the right upper quadrant broke and I went ahead and passed another Novofil through and did a  simple suture, tacked that edge to the mesh.  These were all tied.  With them in place, we used the titanium tie to go around the perimeter and secure this first and then we secured the midportion as well.  She seemed to tolerate this well. There were some little bleeders when the needles were introduced but this slowed which indicates there is no evidence of active bleeding.  She had a total of eight little holes where the guide sutures were placed and three trocar sites.  The 10 mm trocar site in the right lower quadrant was closed with a single suture through the Endclose of 0 Novofil.  The patient seemed to tolerate the procedure well and was taken to the recovery room in satisfactory condition. DD:  10/17/00 TD:  10/17/00 Job: [id number]

## 2011-02-24 NOTE — Discharge Summary (Signed)
[name], [name]               ACCOUNT NO.:  [id number]   MEDICAL RECORD NO.:  [id number]          PATIENT TYPE:  INP   LOCATION:  4733                         FACILITY:  [facility]   PHYSICIAN:  Michaelyn Barter, M.D. DATE OF BIRTH:  [birth year]   DATE OF ADMISSION:  08/11/2004  DATE OF DISCHARGE:  08/14/2004                                 DISCHARGE SUMMARY   DISCHARGE DIAGNOSES:  1.  Unresponsiveness, etiology questionable.  2.  Uncontrolled hypertension.  3.  Chronic bilateral ankle pain.   HISTORY OF PRESENT ILLNESS:  The patient is an 77 year old female with a  past medical history of:  1.  Hypertension.  2.  Transient ischemic attack.  3.  Lower extremity swelling.  At the time of her presentation, the patient was accompanied by her son-in-  law, who stated that he found the patient to be unresponsive for several  minutes. At that time, the patient reported that she did not remember any of  the events of the morning. She denied having any headaches, no chest pain,  no shortness of breath, no fever or chills recently. She denies having any  difficulty swallowing or problems with her memory at that particular time,  other than for the events that led up to her unresponsiveness. She denied  feeling anything other than her baseline over the days preceding her  admission.   HOSPITAL COURSE:  UNRESPONSIVENESS:  At the time of admission, the reason  for the patient's unresponsiveness was unclear. Therefore, multiple causes  have to be ruled out, first of which was any possibility of arrhythmia. The  patient was admitted to the telemetry floor and monitored over the course of  her hospitalization. Over the course of her hospitalization, her telemetry  monitor recorded normal sinus rhythm throughout her hospitalization.  Likewise, the possibility of acute coronary syndrome had to be considered.  The patient had a troponin I completed at the time of admission, which was  recorded at  less than 0.05. Likewise, several days later, the patient also  had a troponin I repeated, which was 0.02 and completely normal. In  addition, there was a concern over the possibility of an acute intracranial  event. Therefore, MRI/MRA of the head was ordered. the final impression of  which was:  1.  Atrophy and small vessel disease.  2.  Prominent perivascular spaces, likely suggesting hypertension.  3.  Tiny acute infarction within the right insular cortex.  4.  No abnormal intracranial enhancement.  5.  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  6.  The acute infarction represents an interval change since the pervious      study.  Further details of this tiny infarction are given and the size is actually  quantified to be sub-centimeter area of acute infarction, again affecting  the insular cortex of the right, sub-centimeter implying that it is very  tiny in its appearance, so tiny that it did not seem very possible to a  counselor that unresponsiveness that the patient presented with at home. I  called neurology regarding this finding because I again, was not convinced  that this finding was significant enough to account for the overall loss of  consciousness and because the patient never displayed any symptoms of  weakness, no loss of motor or sensory sensations. I spoke with Dr. Vickey Huger,  who happened to be the neurologist on call on August 13, 2004 and likewise,  she concurred that she did not believe that an infarction that was that tiny  could account for the patient's symptoms at the time of admission.  Therefore, the way that I approached the CT finding was, I provided the  patient with aspirin only. In addition, at the time of admission, the  patient's blood pressure was significantly elevated with a recording of  213/82 while the patient was in the emergency room. This elevated blood  pressure could have  been associated with this momentary unresponsiveness  that the patient displayed just prior to her hospitalization. Therefore, her  blood pressure was treated very aggressively over the course of her  hospitalization and over the course of her hospitalization, her blood  pressure remained controlled. In addition, when the patient was admitted,  there was the concern that the patient may have experienced a pulmonary  embolism. Therefore, a CT scan of the chest with contrast was completed. The  final results of that happen to be no evidence of acute pulmonary embolism.  There was chronic obstructive pulmonary disease present with mild dependent  atelectasis also noted. A small pericardial effusion was seen and tiny  gallstones, incidentally, were noted. In addition to the MRA that was  completed, the patient also had a CT scan of the head completed at the time  of admission which revealed no acute intracranial abnormality identified.  There was some atrophy. There was some old appearing small bilateral  vasoganglia infarcts. Again, following the patient's hospital admission, the  patient appeared to be clear in her thinking throughout the course of her  hospitalization. There was no episode of unresponsiveness throughout the  course of the patient's hospitalization. The patient did appear to attribute  her unresponsiveness to recent changes in her medications that had occurred.  The patient believes that somehow, the fact that her medications had been  recently doubled and also the fact that she had had a new medication started  the day prior to her admission, these changes in her medications may have  precipitated her entry into the hospital. That is, the patient believes that  changes in her medications may have somehow triggered her unresponsiveness.  In addition to the presentation of unresponsiveness, the patient did complain of some leg pain that she has had for quite some time that is  for  at least several month and because of this, she underwent a venous duplex of  the lower extremity to rule out  the possibility of deep vein thrombosis.  The final results of the venous duplex from the right and the left lower  extremities were no evidence of deep vein thrombosis, superficial  thrombosis, or Baker's cyst. In addition, on July 12, 2004, the patient  and her daughter both explained to me that the patient had been having  bilateral lower leg/ankle pain for at least 3 to 4 months. She had explained  that she had presented these complaints to her primary care physician also  and that he was well aware of her current symptoms. Because of her  complaint, x-rays of both of her lower extremities were ordered.  The final  result of which included the left ankle, there is small bony density just  beneath the medial malleolus, which could represent either a small acute  avulsion or old trauma. The ankle joint appears normal. Degenerative  calcaneal spurs were noted. The right ankle showed no definite acute  fracture. There was irregularity of both the medial and posterior malleoli,  which appear to be due to prior trauma. There is loss of cortical margin and  demineralization of the medial aspect of the tarsonavicular and  osteomyelitis could not be excluded. To further understand the last  statement that osteomyelitis could not be excluded, I personally went down  to the radiology department and had one of the radiologist, Dr. Karin Golden  review the x-rays with me and regarding that last statement that  osteomyelitis could not be excluded, Dr. Karin Golden addressed that. He stated  that there was nothing on the x-rays to conclude that there was  osteomyelitis present. He stated that in summary, the patient's x-rays  revealed flat feet and degenerative changes.   CONDITION ON DISCHARGE:  Significantly improved.   DISPOSITION:  The patient has not experienced any symptoms of   unresponsiveness over the course of her hospitalization. She has not  complained of any headaches, no light headedness, no chest pain, and no  shortness of breath over the course of her hospitalization. Therefore, the  patient will be discharged home.   DISCHARGE MEDICATIONS:  1.  Norvasc 7.5 mg 1 tablet p.o. q.d.  2.  Aspirin 325 mg 1 tablet q.d.  3.  Evista 60 mg 1 tablet p.o. q.d.   SPECIAL INSTRUCTIONS:  The patient is instructed to take all of her  medications as prescribed. She is instructed to talk to her primary care  physician about possibly arranging for an EEG as an outpatient to followup  the CT scan results   FOLLOW UP:  To followup with her primary care physician within 1 to 2 weeks  for further adjustments in her medications.      Orla   OR/MEDQ  D:  08/13/2004  T:  08/14/2004  Job:  [id number]   cc:   Wilson Singer, M.D.  [facility]  [place]  [place] [address]  Fax: [phone]

## 2011-02-24 NOTE — Discharge Summary (Signed)
NAME:  [name], [name]               ACCOUNT NO.:  [id number]   MEDICAL RECORD NO.:  [id number]          PATIENT TYPE:  INP   LOCATION:  1439                         FACILITY:  [facility]   PHYSICIAN:  Hind I Elsaid, MD      DATE OF BIRTH:  06/24/1916   DATE OF ADMISSION:  12/05/2006  DATE OF DISCHARGE:                               DISCHARGE SUMMARY   INTERIM DISCHARGE SUMMARY:   DISCHARGE DIAGNOSES:  1. Right lower lobe pneumonia.  2. Hemoptysis, most probably due to right lower lobe pneumonia.  3. Non-ST segment elevation myocardial infarction.  4. Hyponatremia, resolved.  5. Hypertension.   DISCHARGE MEDICATIONS:  To be addressed on discharge.   CONSULTATION:  Cardiology consulted by Dr. Viann Fish for elevated  troponin.   PROCEDURE:  CT angiogram was done on December 06, 2006, which was  negative for pulmonary embolism, new air space disease along the  superior segment of the right lower lobe.  Findings are most consistent  with a focus of pneumonia, mild adenopathy and the chest has no  significant change.  CT abdomen and pelvis was negative.  I was unsure  if  small-bowel pleural effusion was bibasilar atelectasis or right  lower lobe pneumonia, a small hiatal hernia, status post splenectomy,  intravenous transformation of the portal vein, enlarging seroma also set  with anterior abdominal wall mesh.  Another x-ray was done on December 07, 2006, showed better segment right lower lobe infiltrate on CT chest.  Recommend follow-up.  Chest x-ray done on March, bilateral central and  basilar pulmonary opacities are stable.  Bibasilar atelectasis or small  pleural effusions are unchanged.  A 2-D echo was done on December 06, 2006, left ventricular ejection  fraction 50%, mild hypokinesis of the anterior septal wall.  Left  ventricular wall thickness was moderately to markedly increased.  Mild  asymmetric septal hypertrophy.  Estimated peak pulmonary artery systolic  was  mildly increased to 35.  A small pericardial effusion circling the  heart.   BRIEF HISTORY:  [name] is an 77 year old female with past medical  history of hypertension, she states she complained of cough for greater  than one week.  She has been coughing over the past several days and  actually saw her primary care physician, Dr. Karilyn Cota, yesterday, before  the day of admission.  They started her on antibiotics.  Then the day of  admission she started coughing and she began to cough up a significant  amount of blood and she states she had multiple results of coughing up  of blood.  Her symptoms persisted up until the time she came to the  emergency room.  Patient mainly admitted for coughing blood.  Hemoglobin  remained stable.   PROBLEM #1 -  COUGHING BLOOD:  A CT scan was done which showed bilateral  wall segment pneumonia.  Patient was started on antibiotics and IV  fluids and admitted to telemetry for close monitoring.  Patient, when  seen, she was coughing significant amount of bloody sputum.  Patient was  kept on close observation for her respiratory  status.  Sputum for  culture and Gram stain was done.  Also sputum for fungal infection was  collected and sputum for acid-fast bacilli was collected.  During  hospitalization, with  broad spectrum antibiotics to Zosyn and  vancomycin.  During hospitalization, amount of blood being coughed was  significantly decreased in amount.  Today, scan amount of blood was  coughed.  In my opinion, it most probably is due to right lower lobe  pneumonia but I cannot decide if there is underlying hidden malignancy  under the pneumonia.  So, will continue with  broad spectrum antibiotic.  Amount of blood being coughed is significantly decreased.  Will repeat  another CAT scan of the chest during hospitalization to see if there is  significant improvement.  If there is not anything, this is an  improvement on the chest x-ray, will consider calling  pulmonary consult  for possible bronchoscopy or VATS to evaluate the area of consolidation  or area of opacities.  White blood cells continue to remain stable,  around 15.1 despite  broad spectrum antibiotics.  Plan for this patient  to repeat another CAT scan with the possibility of calling pulmonary  consult if there is no decrease in the size of the inflammation or  consolidation as seen on the first CAT scan.   PROBLEM #2 -  MILD ELEVATION OF TROPONIN:  Cardiology was consulted by  Dr. Viann Fish.  He thinks it is most probably due to a non-ST  elevation MI and as the patient has a significant amount of hemoptysis  on the day of admission, he just started the patient on a small dose of  aspirin and a small dose of beta-blocker as the blood pressure allows as  the patient was hypotensive on the day of admission.  No further  surgical intervention at this time.  HMG coA reductase inhibitor was  also added to the medication.   PROBLEM #3 -  HYPERTENSION:  During admission, had a low blood pressure,  maybe due to the severity of the pneumonia.  Patient responded  immediately to the IV fluids with blood pressure remaining stable since  hospitalization.   PROBLEM #4 -  HYPONATREMIA:  Completely resolved with IV fluids.   PROBLEM #5 -  Patient has hemoglobin on admission of 12 and anemia.  Hemoglobin remained stable.   PROBLEM #6 -  HYPERTENSION:  We added small dose of beta-blocker to the  medication.   DISPOSITION:  Patient was able to be discharged home after improvement  of her respiratory status pending CT scan of her chest to evaluate for  improvement on the right lower lobe consolidation/opacities.  If no  significant improvement, will consider pulmonary consult.  If patient  significantly improves will possibly discharge to home and repeat a CAT  scan after six weeks.      Hind Bosie Helper, MD  Electronically Signed    HIE/MEDQ  D:  12/10/2006  T:  12/10/2006  Job:   [id number]

## 2011-02-24 NOTE — Discharge Summary (Signed)
[facility]  Patient:    [name], [name]                      MRN: [id number] Proc. Date: 10/17/00 Adm. Date:  [id number] Disc. Date: [id number] Attending:  Katha Cabal                           Discharge Summary  ADMISSION DIAGNOSIS:  Ventral hernia.  DISCHARGE DIAGNOSIS:  Ventral hernia, status post laparoscopic ventral hernia repair with Gore-Tex mesh.  HOSPITAL COURSE:  [name] was admitted on October 17, 2000, and underwent the afore mentioned procedure.  Postoperatively, she did well.  She is elderly, at being 77 years old, and it took her a while to get over this.  She began passing gas, and was begun on a diet.  She was ready for discharge on October 22, 2000.  DISCHARGE MEDICATIONS:  Tylox for pain.  FOLLOWUP:  Asked to return in two weeks.  CONDITION ON DISCHARGE:  Good. DD:  10/31/00 TD:  10/31/00 Job: [id number]

## 2011-02-24 NOTE — H&P (Signed)
NAME:  [name], [name] NO.:  [id number]   MEDICAL RECORD NO.:  [id number]          PATIENT TYPE:  EMS   LOCATION:  ED                           FACILITY:  [facility]   PHYSICIAN:  Michaelyn Barter, M.D. DATE OF BIRTH:  [birth year]   DATE OF ADMISSION:  12/06/2006  DATE OF DISCHARGE:                              HISTORY & PHYSICAL   PRIMARY CARE PHYSICIAN:  Dr. Lilly Cove.   CHIEF COMPLAINT:  Coughing up large amounts of blood.   HISTORY OF PRESENT ILLNESS:  Ms. Ke is an 77 year old female with a  past medical history of hypertension, who states that she has been  suffering from a cold for greater than 1 week.  She has been coughing  over the past several days and actually saw her primary care physician,  Dr. Karilyn Cota yesterday.  At that particular time, she was started on a  course of antibiotics, the name of which the patient cannot remember.  She was also given a cortisone injection.  She states that today at  approximately 6:30 p.m. while driving her car, she began coughing.  Her  cough continued and she began to cough up significant amounts of blood.  She would spit the blood out, only to cough up more blood later.  She  states that she had multiple episodes of coughing up blood.  Her  symptoms persisted up until the time she came to the emergency  department for evaluation.  Shortly after arriving into the ER, the  coughing up of blood ceased and she has not coughed up any more blood  since being in the ER.  She denies having any nausea, vomiting, fevers  or chills.  She states she had some chest discomfort associated with her  cough, but excluding coughing, she denied having any chest pain.  No  significant weight changes recently.  She lives alone and denies any  sick contacts.  She initially denied having any shortness of breath, but  stated that since the hemoptysis started at approximately 6:30 p.m., she  has had some slight shortness of breath.   PAST MEDICAL HISTORY:  1. Unresponsiveness during November 2005.  2. Hypertension.  3. Chronic bilateral ankle pain.  4. Questionable TIA.   ALLERGIES:  1. PENICILLIN causes a rash.  2. SULFA causes jaundice.  3. NEOSPORIN.  4. PREDNISONE.   CURRENT HOME MEDICATIONS:  The patient cannot remember the names of any  of her medications.   PAST SURGICAL HISTORY:  Cataract surgery in both eyes.   SOCIAL HISTORY:  The patient denies alcohol; she also denies cigarettes.   FAMILY HISTORY:  The mother had phlebitis.   REVIEW OF SYSTEMS:  As per HPI; otherwise, all other systems are  negative.   PHYSICAL EXAMINATION:  GENERAL:  The patient is awake and cooperative.  She reveals no obvious respiratory distress.  VITALS:  Her temperature is 97.2, blood pressure 153/67, heart rate 97,  respirations 18 and O2 SAT 95%.  HEENT:  Normocephalic, atraumatic.  Oral mucosa is pink.  No exudates.  A dental plate is in the upper region of  the mouth, partial in the lower  region of the mouth.  NECK:  No JVD.  Supple.  No lymphadenopathy.  CARDIAC:  Heart sounds are distant.  RESPIRATORY:  No crackles are wheezes.  ABDOMEN:  Soft, nontender and non-distended.  EXTREMITIES:  No leg edema.  NEUROLOGIC:  The patient is alert and oriented x3.  MUSCULOSKELETAL:  Upper and lower extremity strength 5/5.   LABORATORY AND ACCESSORY CLINICAL DATA:  White blood cell count is 17.4,  hemoglobin 12.2, hematocrit 36.6, platelets 287,000.  D-dimer 1.09.  Sodium 124, potassium 4.5, chloride 90, CO2 25, BUN 10, creatinine 0.71,  glucose 109.  CK-MB POC 9.1, 15.2 and 12.0, respectively.  Troponin I  POC 1.39, 2.29, 2.42.  Myoglobin POC 91.6, 85.7 and 111.   CT scan of the chest was completed, which revealed no PE, new airspace  disease in the superior segment of the right lower lung near the hilum,  consistent with a pneumonia, stable mild chest adenopathy.   EKGs were completed and I compared the EKG that was  completed on  December 05, 2006 to an EKG that was completed August 11, 2004 and  there is no significant change from those 2 EKGs, both of which reveal  right bundle branch block.  The EKG from December 05, 2006 reveals first-  degree AV block, but other than that, there is no significant change  from the prior EKG done August 11, 2004.   ASSESSMENT AND PLAN:  1. Hemoptysis:  This is most likely related to the pneumonia that was      shown on the patient's CAT scan.  We will initiate empiric      intravenous antibiotics with Avelox for now, especially in light of      the patient's being allergic to penicillin.  We will also check      sputum as well as blood cultures x2.  2. Elevated troponin I/CK-MB point-of-care markers:  The significant      of these markers is questionable.  Again, the patient initially      denied having any chest pain, but then went on to state that she      does have some chest discomfort associated with her cough;      therefore, the relationship to a cardiac source for the elevation      of her point-of-care markers is questionable; that is, the      significance of these elevations and the patient's markers is      questionable.  We will cycle the patient's troponin I and CK-MB x3      every 8 hours apart.  We will check a 2-D echocardiogram as well as      a fasting lipid profile.  We will also consult Cardiology in the      morning.  3. Hyponatremia:  We will provide 0.9% normal saline for now.  4. Leukocytosis:  This is most likely related to the pneumonia.  We      will again start empiric intravenous      antibiotics.  5. Hypertension:  We will start Norvasc for now.  6. Gastrointestinal prophylaxis:  We will provide Protonix.      Michaelyn Barter, M.D.  Electronically Signed     OR/MEDQ  D:  12/06/2006  T:  12/06/2006  Job:  [id number]   cc:   Wilson Singer, M.D.

## 2011-02-24 NOTE — Discharge Summary (Signed)
NAME:  [name]               ACCOUNT NO.:  [id number]   MEDICAL RECORD NO.:  [id number]          PATIENT TYPE:  INP   LOCATION:  1439                         FACILITY:  [facility]   PHYSICIAN:  Hettie Holstein, D.O.    DATE OF BIRTH:  16-Sep-1916   DATE OF ADMISSION:  12/05/2006  DATE OF DISCHARGE:                               DISCHARGE SUMMARY   ADDENDUM:   DISCHARGE MEDICATIONS:  1. Aspirin 81 mg daily.  2. Lipitor 20 mg daily with explicit instructions to Ms. Reny to      follow her liver function testing with her primary care physician.  3. Avelox 400 mg daily until December 26, 2006.  4. Diflucan 100 mg daily x5 days.  5. Lasix 40 mg daily with recommendations to follow her basic      metabolic panel on followup with her peritoneal cavity within one      week to assure that her electrolytes and renal function are stable      on this medication.  6. Cozaar 50 mg daily.  7. Lopressor 25 mg twice daily.  8. Fosamax weekly as before.  She was instructed to check her weights      daily and notify her primary physician if she experienced a weight      gain greater than 3 pounds.   BRIEF SUMMARY OF SUBSEQUENT HOSPITAL COURSE:  [name] subsequently  hospital course has been that of gradual improvement.  She was seen by  cardiology, Dr. Donnie Aho, as well as Dr. Patty Sermons, and her BNP was  followed throughout her course.  On the day of discharge, she had a BNP  of 148.  That was markedly improved from prior assessments.  Her  pneumonia clinically resolved.  Her hemoptysis, as well, resolved.  Her  white count remained in the 13-15,000 range, and again, this can be  followed by her primary care physician over the course of the next  couple of weeks.  I would recommend a chest x-ray to be performed as  well within the next couple of weeks to assure that this is stable.  Her  chest radiograph on the day of discharge revealed decreased effusions  and lower lobe densities.  Overall, her  chest x-ray was markedly  improved from previous.   DISPOSITION:  [name] is felt to be medically stable and in improved  condition.  She is to follow up with her primary care physician this  week.  I have instructed her to call for an appointment as well as her  cardiologist, Dr.  Patty Sermons, in two weeks.  Again, she is provided the numbers to call to  coordinate these appointments.  Her weight at time of discharge was 65.2  kg.  There is some discrepancy here.  The day previous, her weight was  64.6 kg, though she is negative 2300 mL on this past 24 hours.      Hettie Holstein, D.O.  Electronically Signed     ESS/MEDQ  D:  12/18/2006  T:  12/18/2006  Job:  [id number]   cc:   Johnna Acosta  Malachi Carl, MD   Wilson Singer, M.D.  Fax: [phone]   Cassell Clement, M.D.  Fax: [phone]

## 2011-02-24 NOTE — H&P (Signed)
NAME:  [name], [name]               ACCOUNT NO.:  [id number]   MEDICAL RECORD NO.:  [id number]          PATIENT TYPE:  INP   LOCATION:  4733                         FACILITY:  [facility]   PHYSICIAN:  Gertha Calkin, M.D.DATE OF BIRTH:  07/10/1916   DATE OF ADMISSION:  08/11/2004  DATE OF DISCHARGE:                                HISTORY & PHYSICAL   PRIMARY CARE PHYSICIAN:  Wilson Singer, M.D.   CHIEF COMPLAINT:  Found unresponsive this morning by family members.   HISTORY OF PRESENT ILLNESS:  This is a pleasant 77 year old female with  multiple medical problems.  She presents after being found by her family  members unresponsive to physical stimuli.  The son-in-law states that he had  to violently shake her for several minutes before she was finally  responsive.  The patient reports that she does not remember any of this this  morning.  The patient denies any headache, chest pain, shortness of breath.  No fever or chills recently. The patient denies any problems with swallowing  difficulty or memory.  The patient states that she is usually at her normal  active self and there were no symptoms present last night or days following  today's admission.   She denies any trauma, denies any syncopal episodes.  No lightheadedness or  dizziness noted.  She has been having good p.o. intake.  Does not complain  of pain in her abdomen or leg.  No headaches.  No visual changes.   PAST MEDICAL HISTORY:  1.  Hypertension.  2.  She is postmenopausal.  3.  Presumed osteoporosis (given medication list).  4.  History of TIA back in May or June of 2005.  5.  She also had a history of lower extremity swelling, notably of the left      side greater than the right, again around May or June of this year.   PAST SURGICAL HISTORY:  1.  Splenectomy.  2.  Hysterectomy.  3.  Incisional hernia repair.   MEDICATIONS:  Evista, Enalapril, and Demodex.  She is also on calcium  supplementation.   ALLERGIES:  She is allergic to PENICILLIN and SULFA which causes her to have  rash and she also states that NEOSPORIN OINTMENT causes her to blister in  that localized area.   FAMILY HISTORY:  Positive for phlebitis in her mother.  Denies hypertension,  diabetes, or coronary artery disease existing within the family.   SOCIAL HISTORY:  She stays by herself.  She is fully independent.  Denies  tobacco, alcohol, or illicit drug use.   REVIEW OF SYMPTOMS:  Positive for some memory lapses but this has been a  chronic decline over several years.  No acute changes recently.  She is  fully independent.  Does not need a walker or cane ambulation.  She stays at  home by herself.  She denied any hematochezia, melena.  No constipation or  diarrhea.  No fevers, chills, cough.  No headaches, visual changes, hearing  changes, difficulty swallowing.  She does state that earlier this year was  the first episode of her lower  extremity swelling.  She was told at that  time that it may be poison oak, poison ivy or possibly a reaction to an  insect bite.  She denies that leg having problems with pruritus.  Other  review of systems were negative.   PHYSICAL EXAMINATION:  VITAL SIGNS:  Temperature 98.6, pulse of 88,  respiration 20, blood pressure 192/69.  She is saturating 96% on room air.  GENERAL:  No acute distress.  Alert and oriented female.  HEENT:  Unremarkable.  NECK:  Without JVP.  No palpable masses.  Supple.  CARDIOVASCULAR:  Regular rate and rhythm.  No murmurs, gallops, or rubs.  CHEST:  Clear to auscultation bilaterally with good air movement.  ABDOMEN:  Soft, nontender, and nondistended.  Bowel sounds are present.  There is no rebound or guarding.  EXTREMITIES:  Bilateral symmetric appearing extremities with no swelling at  this time.  There is no tenderness and Homans' sign is negative.  No  palpable cords in either lower extremity.  PULSES:  Intact and 2+ upper and lower extremities and  symmetric.  SKIN:  Without rashes or lesions noted.  NEUROLOGICAL:  Cranial nerves II through XII intact.  There is no pronator  drift.  Muscle strength is 5/5 and symmetric throughout.  Sensation is  intact.  MUSCULOSKELETAL:  She is complaining of superficial tenderness in the medial  aspect near right above the ankle of her left leg.   LABORATORY DATA:  CT of the chest to rule out PE came back negative for PE  and noted a small pericardial effusion.  Chest x-ray was negative for any  acute infiltrative processes or cardiomegaly.  Head CT was negative for  bleed.  Pulmonary showing no chronic atrophy changes.   White count is 13.3, hemoglobin is 14.2, hematocrit of 42.3, MCV of 89,  platelets of 297,000.  Her ANC is not elevated.  Her sodium is 134,  potassium 4, chloride 103, BUN of 14, glucose of 696, bicarbonate of 23.3.  That was an I-stat.  One set of point of care cardiac markers were  completely negative.  She did have elevated d-dimer at 0.95.  UA was  negative.  BNP was 37.   ASSESSMENT/PLAN:  1.  Unresponsive:  At this time, differential includes arrhythmia which      includes ventricular tachycardia or prolonged QRS, possibly      supraventricular tachycardia.  Also worry about recurrent transient      ischemic attack.  It is also possible that she could have had an embolic      event that was transient rather than ischemic transient ischemic attack.      In addition to her workup thus far in the emergency department, we will      add a MRI and MRA to determine if she has any significant vessel      vascular disease. We will also add a 2-D echocardiogram to determine      whether she has preserved left ventricular function and rule out a      possible thrombus.  Add a CMET to her labs today and coagulases.  She is      currently obtaining Doppler ultrasounds of her legs given her history of     intermittent unilateral leg swelling.  We will need to follow up on       that.  In the morning, we will obtain routine labs, CBC, and BMET.  2.  Hypertension:  This may also be  due to hypertensive encephalopathy,      although not your classic presentation.  However, given her blood      pressure initially, systolic above 210, we will start her back on her      antihypertensive medications a half dose as we do not  want to      precipitate a cerebrovascular accident if this truly was the cause      of her presentation.  We will add medications as needed to control blood      pressure while we follow her neurologically.  3.  Osteoarthritis:  We will resume her Evista and calcium supplementation.       JD/MEDQ  D:  08/11/2004  T:  08/11/2004  Job:  [id number]   cc:   Wilson Singer, M.D.  [address]  [place]  [place] [address]  Fax: [phone]   Cassell Clement, M.D.  [address], [address]  [place]  [place] [address]  Fax: [phone]

## 2011-02-24 NOTE — Consult Note (Signed)
NAME:  [name], [name]               ACCOUNT NO.:  [id number]   MEDICAL RECORD NO.:  [id number]          PATIENT TYPE:  INP   LOCATION:  1426                         FACILITY:  [facility]   PHYSICIAN:  Georga Hacking, M.D.DATE OF BIRTH:  01/06/1916   DATE OF CONSULTATION:  12/06/2006  DATE OF DISCHARGE:                                 CONSULTATION   I was asked to see this 77 year old female this evening by Dr. Roxan Hockey.  The patient previously has been in recently good health and has been  treated with hypertension and has a history of unresponsiveness several  years ago.  She has seen Dr. Patty Sermons in the past for a stress test and  a few years ago had an echocardiogram.  She has no history of heart  attack or angina.  She still lives at home and is able to maintain her  own affairs.  She normally does not have angina.  She has had a upper  respiratory infection and cold and was treated with Zithromax.  Last  evening, she developed fairly profound hemoptysis and was brought to the  [facility] emergency room.  A CT scan showed a right lower lobe  pneumonia but no evidence of pulmonary emboli.  Symptoms persisted until  arrival to the emergency room.  She had pleuritic chest pain associated  with this but did not really have any anginal-type pain.  After she  finished coughing, she had some vague chest discomfort that was later  relieved with intravenous morphine.  Nurse's notes record a systolic  blood pressure of 70/38 at 6:00 a.m. this morning when the patient said  that she did not feel right and was pale and sweaty.  She received an  intravenous fluid bolus, and her blood pressure came up to 106/56.  She  had mild elevation of troponin in the emergency room.  Point-of-care  markers and subsequent enzymes have shown a CK-MB of 16.6 with a normal  total CPK and a troponin of 2.68.  She currently feels better.  She is  not hypoxic and is not having any significant chest discomfort.   Her past history if remarkable for hypertension.  There is also a  history of a TIA.  She has a history of unresponsive several years ago  that was evaluated.  There is a history of a hiatal hernia that has been  treated surgically in the past.   PAST SURGICAL HISTORY:  She has had repair of a hiatal hernia several  years ago.  She has a history of a partial hysterectomy, appendectomy,  tonsillectomy, and removal of a breast lump.  She has a history of  chronic bilateral ankle pain.   ALLERGIES:  PENICILLIN causes rash.  SULFA causes jaundice.  NEOSPORIN  and PREDNISONE.   CURRENT MEDICATIONS:  She is somewhat sketchy on the details but reports  that she has been on Benicar.  In addition, it is listed in the chart  that she has been taking Fosamax and Zithromax as well as Evista.   SOCIAL HISTORY:  She denies alcohol or cigarette abuse.  She  is  currently widowed.  She has seven children, a number of grandchildren  and great-grandchildren.  She currently maintains her home alone and  attends [employer].   FAMILY HISTORY:  Noncontributory.   REVIEW OF SYSTEMS:  She has had a voluntary weight loss of around 30  pounds from dieting.  She has no skin disorders or problems.  She has  had bilateral cataract extraction.  There is no history of glaucoma.  She has had hearing loss since an episode of unresponsiveness several  years ago in the left ear.  She has not had a previous history of  pneumonia.  She has no previous history of palpitations or congestive  heart failure.  She has a history of a hiatal hernia repair and  occasional constipation.  She has no history of urinary tract infection,  kidney stones, or urinary problems.  She has had chronic ankle pain  following an injury at work several years ago.  There is no history of  stroke or TIA.  Other than as noted above, the remainder of the review  of systems is unremarkable.   PHYSICAL EXAMINATION:  GENERAL:  She is  an elderly female appearing  younger than stated age.  VITAL SIGNS:  Blood pressure is currently 110/70.  Her pulse is  currently 70 and regular.  SKIN:  Warm and dry.  HEENT:  EOMI.  PERRLA.  C/S clear.  Fundi not examined.  Pharynx  negative.  NECK:  Supple without masses, JVD, thyromegaly, or bruits.  LUNGS:  Decreased breath sounds in the right lower base.  CARDIOVASCULAR:  Normal S1 and S2.  There was no S3, S4, or murmur.  ABDOMEN:  Soft and nontender.  Distal pulses are 2+.  There are mild  venous varicosities.  NEUROLOGIC:  Normal.   EKG shows sinus rhythm.  There is a right bundle branch block pattern  which is unchanged from previous ECG.   Laboratory data today shows a white count of 18,200, hemoglobin is 11.4,  hematocrit 33.3.  She was hyponatremic on admission.  Sodium is  currently 128, potassium 5, glucose 118, BUN 11, creatinine 0.89.  Troponin is 1.45.  MB is 10.4.   IMPRESSION:  1. Small subendocardial myocardial infarction, as manifested by      microelevation of cardiac enzymes.  It is likely this is due to the      stress of the pneumonia as well as hypotension that occurred      earlier this morning.  She has no documented history of cardiac      disease previously of significance.  2. Right lower lobe pneumonia with hemoptysis.  3. History of hypertension with relative hypotension earlier.  4. History of transient ischemic attack and possible transient      infusion earlier.   RECOMMENDATIONS:  She has fairly significant hemoptysis, and I would not  anticoagulate her at this time.  I would treat her with low-dose aspirin  and initiate beta blockers because of this possible small myocardial  infarction.  I would continue to treat her aggressively for pneumonia  and follow up the echocardiogram.  At this point, unless she has  recurrent symptoms at her age, I would treat conservatively with medicines unless there are further issues or problems.       Georga Hacking, M.D.  Electronically Signed     WST/MEDQ  D:  12/06/2006  T:  12/06/2006  Job:  [id number]   cc:   Nimish C.  Karilyn Cota, M.D.  Fax: [phone]   Michaelyn Barter, M.D.   Cassell Clement, M.D.  Fax: [phone]

## 2011-04-03 ENCOUNTER — Other Ambulatory Visit (INDEPENDENT_AMBULATORY_CARE_PROVIDER_SITE_OTHER): Payer: Medicare Other | Admitting: *Deleted

## 2011-04-03 ENCOUNTER — Encounter: Payer: Self-pay | Admitting: Cardiology

## 2011-04-03 ENCOUNTER — Ambulatory Visit (INDEPENDENT_AMBULATORY_CARE_PROVIDER_SITE_OTHER): Payer: Medicare Other | Admitting: Cardiology

## 2011-04-03 DIAGNOSIS — I10 Essential (primary) hypertension: Secondary | ICD-10-CM

## 2011-04-03 DIAGNOSIS — E871 Hypo-osmolality and hyponatremia: Secondary | ICD-10-CM

## 2011-04-03 DIAGNOSIS — I119 Hypertensive heart disease without heart failure: Secondary | ICD-10-CM

## 2011-04-03 DIAGNOSIS — E785 Hyperlipidemia, unspecified: Secondary | ICD-10-CM

## 2011-04-03 DIAGNOSIS — I259 Chronic ischemic heart disease, unspecified: Secondary | ICD-10-CM

## 2011-04-03 DIAGNOSIS — Z79899 Other long term (current) drug therapy: Secondary | ICD-10-CM

## 2011-04-03 LAB — HEPATIC FUNCTION PANEL
ALT: 16 U/L (ref 0–35)
AST: 23 U/L (ref 0–37)
Albumin: 4 g/dL (ref 3.5–5.2)
Alkaline Phosphatase: 72 U/L (ref 39–117)

## 2011-04-03 LAB — BASIC METABOLIC PANEL
BUN: 15 mg/dL (ref 6–23)
Chloride: 95 mEq/L — ABNORMAL LOW (ref 96–112)
GFR: 60.52 mL/min (ref >60.00)
Glucose, Bld: 93 mg/dL (ref 70–99)
Potassium: 4.4 mEq/L (ref 3.5–5.1)
Sodium: 131 mEq/L — ABNORMAL LOW (ref 135–145)

## 2011-04-03 LAB — LIPID PANEL
Cholesterol: 212 mg/dL — ABNORMAL HIGH (ref 0–200)
Total CHOL/HDL Ratio: 3
VLDL: 21 mg/dL (ref 0.0–40.0)

## 2011-04-03 LAB — LDL CHOLESTEROL, DIRECT: Direct LDL: 114.7 mg/dL

## 2011-04-03 NOTE — Assessment & Plan Note (Signed)
The patient has a long history of essential hypertension.  She has not been having any headaches but states that her head feels "funny" for the past 3 days.  She has had similar symptoms in the past.  She has seen Dr. Haroldine Laws for problems with dizziness and otitis media.  She's not having any chest pain or shortness of breath she previously had been taking half of a Lasix every other day but recently has taken a whole Lasix each day.  She has a past history of hyponatremia and the high-dose Lasix may be aggravating This.

## 2011-04-03 NOTE — Assessment & Plan Note (Signed)
We're checking electrolytes on her today

## 2011-04-03 NOTE — Progress Notes (Signed)
[name] Date of Birth:  1916/01/07 [facility] / [facility] [address]   [address] [place], [place]  [address] [phone]           Fax   [phone]  History of Present Illness: This pleasant 67 woman is seen for a scheduled followup office visit.  She has a history of a prior subendocardial myocardial infarction in 2008 normal hospitalized for acute pneumonia.  She had a subsequent outpatient nuclear stress test on 03/01/07 which showed no evidence of ischemia.  She does have a known right bundle-branch block the patient had an echocardiogram on 12/27/2006 which showed normal left ventricle systolic function with an ejection fraction of 55-60% and showed impaired relaxation and showed mild to moderate aortic stenosis with trivial aortic insufficiency.  She had mild pulmonary hypertension at 37 mm mercury.  She has not been expressing any chest pain or angina.  She does have a past history of hyponatremia.  Recently she on her own has stopped up her furosemide from 20 mg every other day to 40 mg daily.  Current Outpatient Prescriptions  Medication Sig Dispense Refill  . aspirin 81 MG tablet Take 81 mg by mouth daily.        . Calcium Carbonate-Vitamin D (CALCIUM 600 + D PO) Take 1 tablet by mouth daily.        . furosemide (LASIX) 40 MG tablet Take 20 mg by mouth every other day. Or as directed      . losartan (COZAAR) 100 MG tablet TAKE 1 TABLET EVERY DAY  30 tablet  11  . metoprolol succinate (TOPROL-XL) 25 MG 24 hr tablet Take 25 mg by mouth daily.        . Multiple Vitamin (MULTIVITAMIN) tablet Take 1 tablet by mouth daily.        . nitroGLYCERIN (NITROSTAT) 0.4 MG SL tablet Place 0.4 mg under the tongue every 5 (five) minutes as needed.        . fluticasone (FLONASE) 50 MCG/ACT nasal spray 2 sprays by Nasal route daily.          Allergies  Allergen Reactions  . Lipitor (Atorvastatin Calcium)     MUSCLE CRAMPS  . Neosporin (Neomycin-Polymyx-Gramicid)   .  Prednisone   . Sulfa Antibiotics   . Penicillins Rash    Patient Active Problem List  Diagnoses  . Hyponatremia  . Hypertension  . Hypercholesterolemia  . Status post splenectomy  . Benign hypertensive heart disease without heart failure  . Ischemic heart disease    History  Smoking status  . Never Smoker   Smokeless tobacco  . Not on file    History  Alcohol Use     No family history on file.  Review of Systems: Constitutional: no fever chills diaphoresis or fatigue or change in weight.  Head and neck: no hearing loss, no epistaxis, no photophobia or visual disturbance. Respiratory: No cough, shortness of breath or wheezing. Cardiovascular: No chest pain peripheral edema, palpitations. Gastrointestinal: No abdominal distention, no abdominal pain, no change in bowel habits hematochezia or melena. Genitourinary: No dysuria, no frequency, no urgency, no nocturia. Musculoskeletal:No arthralgias, no back pain, no gait disturbance or myalgias. Neurological: No dizziness, no headaches, no numbness, no seizures, no syncope, no weakness, no tremors. Hematologic: No lymphadenopathy, no easy bruising. Psychiatric: No confusion, no hallucinations, no sleep disturbance.    Physical Exam: Filed Vitals:   04/03/11 1007  BP: 135/76  Pulse: 80   The general appearance  feels a well-developed well-nourished elderly woman who appears to be younger than her stated age.The head and neck exam reveals pupils equal and reactive.  Extraocular movements are full.  There is no scleral icterus.  The mouth and pharynx are normal.  The neck is supple.  The carotids reveal no bruits.  The jugular venous pressure is normal.  The  thyroid is not enlarged.  There is no lymphadenopathy.  The chest is clear to percussion and auscultation.  There are no rales or rhonchi.  Expansion of the chest is symmetrical.  The precordium is quiet.  The first heart sound is normal.  The second heart sound is  physiologically split.  There is no murmur gallop rub or click.  There is no abnormal lift or heave.  The abdomen is soft and nontender.  The bowel sounds are normal.  The liver and spleen are not enlarged.  There are no abdominal masses.  There are no abdominal bruits.  Extremities reveal good pedal pulses.  There is no phlebitis or edema.  There is no cyanosis or clubbing.  Strength is normal and symmetrical in all extremities.  There is no lateralizing weakness.  There are no sensory deficits.  The skin is warm and dry.  There is no rash.  Assessment / Plan: Continue same medication except decreased furosemide to 40 mg one half tablet daily.  Blood work is pending.  Recheck in 4 months for followup office visit and lipid panel and chemistries

## 2011-04-05 ENCOUNTER — Telehealth: Payer: Self-pay | Admitting: *Deleted

## 2011-04-05 NOTE — Telephone Encounter (Signed)
Advised patient of lab work,she will call if swelling starts to get worse

## 2011-04-05 NOTE — Telephone Encounter (Signed)
Message copied by Adolphus Birchwood on Wed Apr 05, 2011 12:12 PM ------      Message from: Cassell Clement      Created: Tue Apr 04, 2011  8:49 AM       Please report.  The serum sodium is slightly low again at 131.  This could be contributing to her sense of dizziness.  This should improve now that she will be on a lower dose of furosemide one half tablet daily instead of a full tablet daily

## 2011-04-07 ENCOUNTER — Other Ambulatory Visit: Payer: Medicare Other | Admitting: *Deleted

## 2011-08-28 ENCOUNTER — Encounter: Payer: Self-pay | Admitting: Cardiology

## 2011-08-28 ENCOUNTER — Ambulatory Visit (INDEPENDENT_AMBULATORY_CARE_PROVIDER_SITE_OTHER): Payer: Medicare Other | Admitting: Cardiology

## 2011-08-28 ENCOUNTER — Other Ambulatory Visit (INDEPENDENT_AMBULATORY_CARE_PROVIDER_SITE_OTHER): Payer: Medicare Other | Admitting: *Deleted

## 2011-08-28 VITALS — BP 118/80 | HR 76 | Ht 60.0 in | Wt 147.0 lb

## 2011-08-28 DIAGNOSIS — E785 Hyperlipidemia, unspecified: Secondary | ICD-10-CM

## 2011-08-28 DIAGNOSIS — I259 Chronic ischemic heart disease, unspecified: Secondary | ICD-10-CM

## 2011-08-28 DIAGNOSIS — N390 Urinary tract infection, site not specified: Secondary | ICD-10-CM

## 2011-08-28 DIAGNOSIS — Z79899 Other long term (current) drug therapy: Secondary | ICD-10-CM

## 2011-08-28 DIAGNOSIS — I119 Hypertensive heart disease without heart failure: Secondary | ICD-10-CM

## 2011-08-28 DIAGNOSIS — I10 Essential (primary) hypertension: Secondary | ICD-10-CM

## 2011-08-28 DIAGNOSIS — E7800 Pure hypercholesterolemia, unspecified: Secondary | ICD-10-CM

## 2011-08-28 LAB — LIPID PANEL
Cholesterol: 197 mg/dL (ref 0–200)
HDL: 72.4 mg/dL (ref >39.00)
Triglycerides: 82 mg/dL (ref 0.0–149.0)
VLDL: 16.4 mg/dL (ref 0.0–40.0)

## 2011-08-28 LAB — BASIC METABOLIC PANEL
Calcium: 9 mg/dL (ref 8.4–10.5)
GFR: 65.36 mL/min (ref >60.00)
Glucose, Bld: 98 mg/dL (ref 70–99)
Potassium: 4 mEq/L (ref 3.5–5.1)
Sodium: 136 mEq/L (ref 135–145)

## 2011-08-28 LAB — HEPATIC FUNCTION PANEL
ALT: 16 U/L (ref 0–35)
AST: 24 U/L (ref 0–37)
Albumin: 3.7 g/dL (ref 3.5–5.2)
Total Bilirubin: 0.7 mg/dL (ref 0.3–1.2)
Total Protein: 7.3 g/dL (ref 6.0–8.3)

## 2011-08-28 MED ORDER — FLUTICASONE PROPIONATE 50 MCG/ACT NA SUSP: 2.0000 | Freq: Every day | NASAL | Status: AC

## 2011-08-28 NOTE — Assessment & Plan Note (Signed)
The patient denies any recent chest pain or angina

## 2011-08-28 NOTE — Patient Instructions (Signed)
Will obtain a urinalysis and call you with the results Ok to use an extra 1/2 Lasix (furosemide) as needed for increased shortness of breath or increased weight gain Your physician recommends that you schedule a follow-up appointment in: 3 months

## 2011-08-28 NOTE — Assessment & Plan Note (Signed)
The patient is careful with her diet.  She is not on any statin therapy.  At work today is pending

## 2011-08-28 NOTE — Progress Notes (Signed)
[name] Date of Birth:  Mar 01, 1916 [facility] / [facility] [address]   [address] [place], [place]  [address] [phone]           Fax   [phone]  History of Present Illness: This pleasant 53 woman is seen for a scheduled followup office visit.  She has a history of a prior subendocardial myocardial infarction in 2008 normal hospitalized for acute pneumonia.  She had a subsequent outpatient nuclear stress test on 03/01/07 which showed no evidence of ischemia.  She does have a known right bundle-branch block the patient had an echocardiogram on 12/27/2006 which showed normal left ventricle systolic function with an ejection fraction of 55-60% and showed impaired relaxation and showed mild to moderate aortic stenosis with trivial aortic insufficiency.  She had mild pulmonary hypertension at 37 mm mercury.  She has not been expressing any chest pain or angina.  She does have a past history of hyponatremia.  Recently she on her own has stopped up her furosemide from 20 mg every other day to 40 mg daily.  Since last visit she's had no new cardiac symptoms.  Current Outpatient Prescriptions  Medication Sig Dispense Refill  . aspirin 81 MG tablet Take 81 mg by mouth daily.        . Calcium Carbonate-Vitamin D (CALCIUM 600 + D PO) Take 1 tablet by mouth daily.        . fluticasone (FLONASE) 50 MCG/ACT nasal spray Place 2 sprays into the nose daily.  16 g  5  . furosemide (LASIX) 40 MG tablet Take 20 mg by mouth every other day. Or as directed      . losartan (COZAAR) 100 MG tablet TAKE 1 TABLET EVERY DAY  30 tablet  11  . metoprolol succinate (TOPROL-XL) 25 MG 24 hr tablet Take 25 mg by mouth daily.        . Multiple Vitamin (MULTIVITAMIN) tablet Take 1 tablet by mouth daily.        . nitroGLYCERIN (NITROSTAT) 0.4 MG SL tablet Place 0.4 mg under the tongue every 5 (five) minutes as needed.          Allergies  Allergen Reactions  . Lipitor (Atorvastatin Calcium)    MUSCLE CRAMPS  . Neosporin (Neomycin-Polymyx-Gramicid)   . Prednisone   . Sulfa Antibiotics   . Penicillins Rash    Patient Active Problem List  Diagnoses  . Hyponatremia  . Hypertension  . Hypercholesterolemia  . Status post splenectomy  . Benign hypertensive heart disease without heart failure  . Ischemic heart disease  . Urinary tract infection    History  Smoking status  . Never Smoker   Smokeless tobacco  . Not on file    History  Alcohol Use No    No family history on file.  Review of Systems: Constitutional: no fever chills diaphoresis or fatigue or change in weight.  Head and neck: no hearing loss, no epistaxis, no photophobia or visual disturbance. Respiratory: No cough, shortness of breath or wheezing. Cardiovascular: No chest pain peripheral edema, palpitations. Gastrointestinal: No abdominal distention, no abdominal pain, no change in bowel habits hematochezia or melena. Genitourinary: No dysuria, no frequency, no urgency, no nocturia. Musculoskeletal:No arthralgias, no back pain, no gait disturbance or myalgias. Neurological: No dizziness, no headaches, no numbness, no seizures, no syncope, no weakness, no tremors. Hematologic: No lymphadenopathy, no easy bruising. Psychiatric: No confusion, no hallucinations, no sleep disturbance.    Physical Exam: Filed Vitals:  08/28/11 0927  BP: 118/80  Pulse: 76   The general appearance feels a well-developed well-nourished elderly woman who appears to be younger than her stated age.The head and neck exam reveals pupils equal and reactive.  Extraocular movements are full.  There is no scleral icterus.  The mouth and pharynx are normal.  The neck is supple.  The carotids reveal no bruits.  The jugular venous pressure is normal.  The  thyroid is not enlarged.  There is no lymphadenopathy.  The chest is clear to percussion and auscultation.  There are no rales or rhonchi.  Expansion of the chest is symmetrical.  The  precordium is quiet.  The first heart sound is normal.  The second heart sound is physiologically split.  There is no  gallop rub or click.  There is a grade 1/6 systolic ejection murmur at the base There is no abnormal lift or heave.  The abdomen is soft and nontender.  The bowel sounds are normal.  The liver and spleen are not enlarged.  There are no abdominal masses.  There are no abdominal bruits.  Extremities reveal good pedal pulses.  There is no phlebitis.  There is 1+ ankle edema.  There is no cyanosis or clubbing.  Strength is normal and symmetrical in all extremities.  There is no lateralizing weakness.  There are no sensory deficits.  The skin is warm and dry.  There is no rash.  Assessment / Plan: Continue same medication.  Recheck in 3 months for followup office visit and basal metabolic panel.  Await results of urinalysis.

## 2011-08-28 NOTE — Assessment & Plan Note (Signed)
Her blood pressure has been remaining normal on current therapy.  She does not check her blood pressure at home and he has been having mild pedal edema 1+.  She does not have any orthopnea and she sleeps on one pillow.

## 2011-08-28 NOTE — Assessment & Plan Note (Signed)
The patient is having urinary frequency and urgency and thinks she may have another bladder infection.  We are sending her to the lab for a urinalysis with reflex culture and sensitivities.

## 2011-08-29 ENCOUNTER — Telehealth: Payer: Self-pay | Admitting: *Deleted

## 2011-08-29 LAB — URINALYSIS W MICROSCOPIC + REFLEX CULTURE
Crystals: NONE SEEN
Ketones, ur: NEGATIVE mg/dL
Leukocytes, UA: NEGATIVE
Nitrite: NEGATIVE
Specific Gravity, Urine: 1.025 (ref 1.005–1.030)
Urobilinogen, UA: 0.2 mg/dL (ref 0.0–1.0)

## 2011-08-29 NOTE — Telephone Encounter (Signed)
Message copied by Burnell Blanks on Tue Aug 29, 2011  1:21 PM ------      Message from: Cassell Clement      Created: Mon Aug 28, 2011  9:03 PM       Please report.  The labs are stable.  Continue same meds.  Continue careful diet.

## 2011-08-29 NOTE — Telephone Encounter (Signed)
Advised of results

## 2011-08-30 ENCOUNTER — Telehealth: Payer: Self-pay | Admitting: *Deleted

## 2011-08-30 NOTE — Progress Notes (Signed)
Left message

## 2011-08-30 NOTE — Telephone Encounter (Signed)
Message copied by Burnell Blanks on Wed Aug 30, 2011  2:49 PM ------      Message from: Cassell Clement      Created: Wed Aug 30, 2011  8:25 AM       Please report.  The urinalysis was normal.  No sign of infection this time.  Continue to drink plenty of water

## 2011-09-25 ENCOUNTER — Other Ambulatory Visit: Payer: Self-pay | Admitting: *Deleted

## 2011-09-25 MED ORDER — FUROSEMIDE 40 MG PO TABS: 20.0000 mg | ORAL_TABLET | ORAL | Status: DC

## 2011-09-25 NOTE — Telephone Encounter (Signed)
Refilled furosemide

## 2011-09-29 ENCOUNTER — Other Ambulatory Visit: Payer: Self-pay | Admitting: *Deleted

## 2011-09-29 MED ORDER — METOPROLOL SUCCINATE ER 25 MG PO TB24: 25.0000 mg | ORAL_TABLET | Freq: Every day | ORAL | Status: AC

## 2011-09-29 NOTE — Telephone Encounter (Signed)
Refilled metoprolol 

## 2011-10-06 ENCOUNTER — Other Ambulatory Visit: Payer: Self-pay | Admitting: *Deleted

## 2011-10-10 IMAGING — CR DG CHEST 2V
2 series · 2 of 2 positions shown · non-contrast
Comparison: PA and lateral chest 12/17/2010.

CLINICAL DATA: Chest pain.

CHEST - 2 VIEW

[w chest lat]
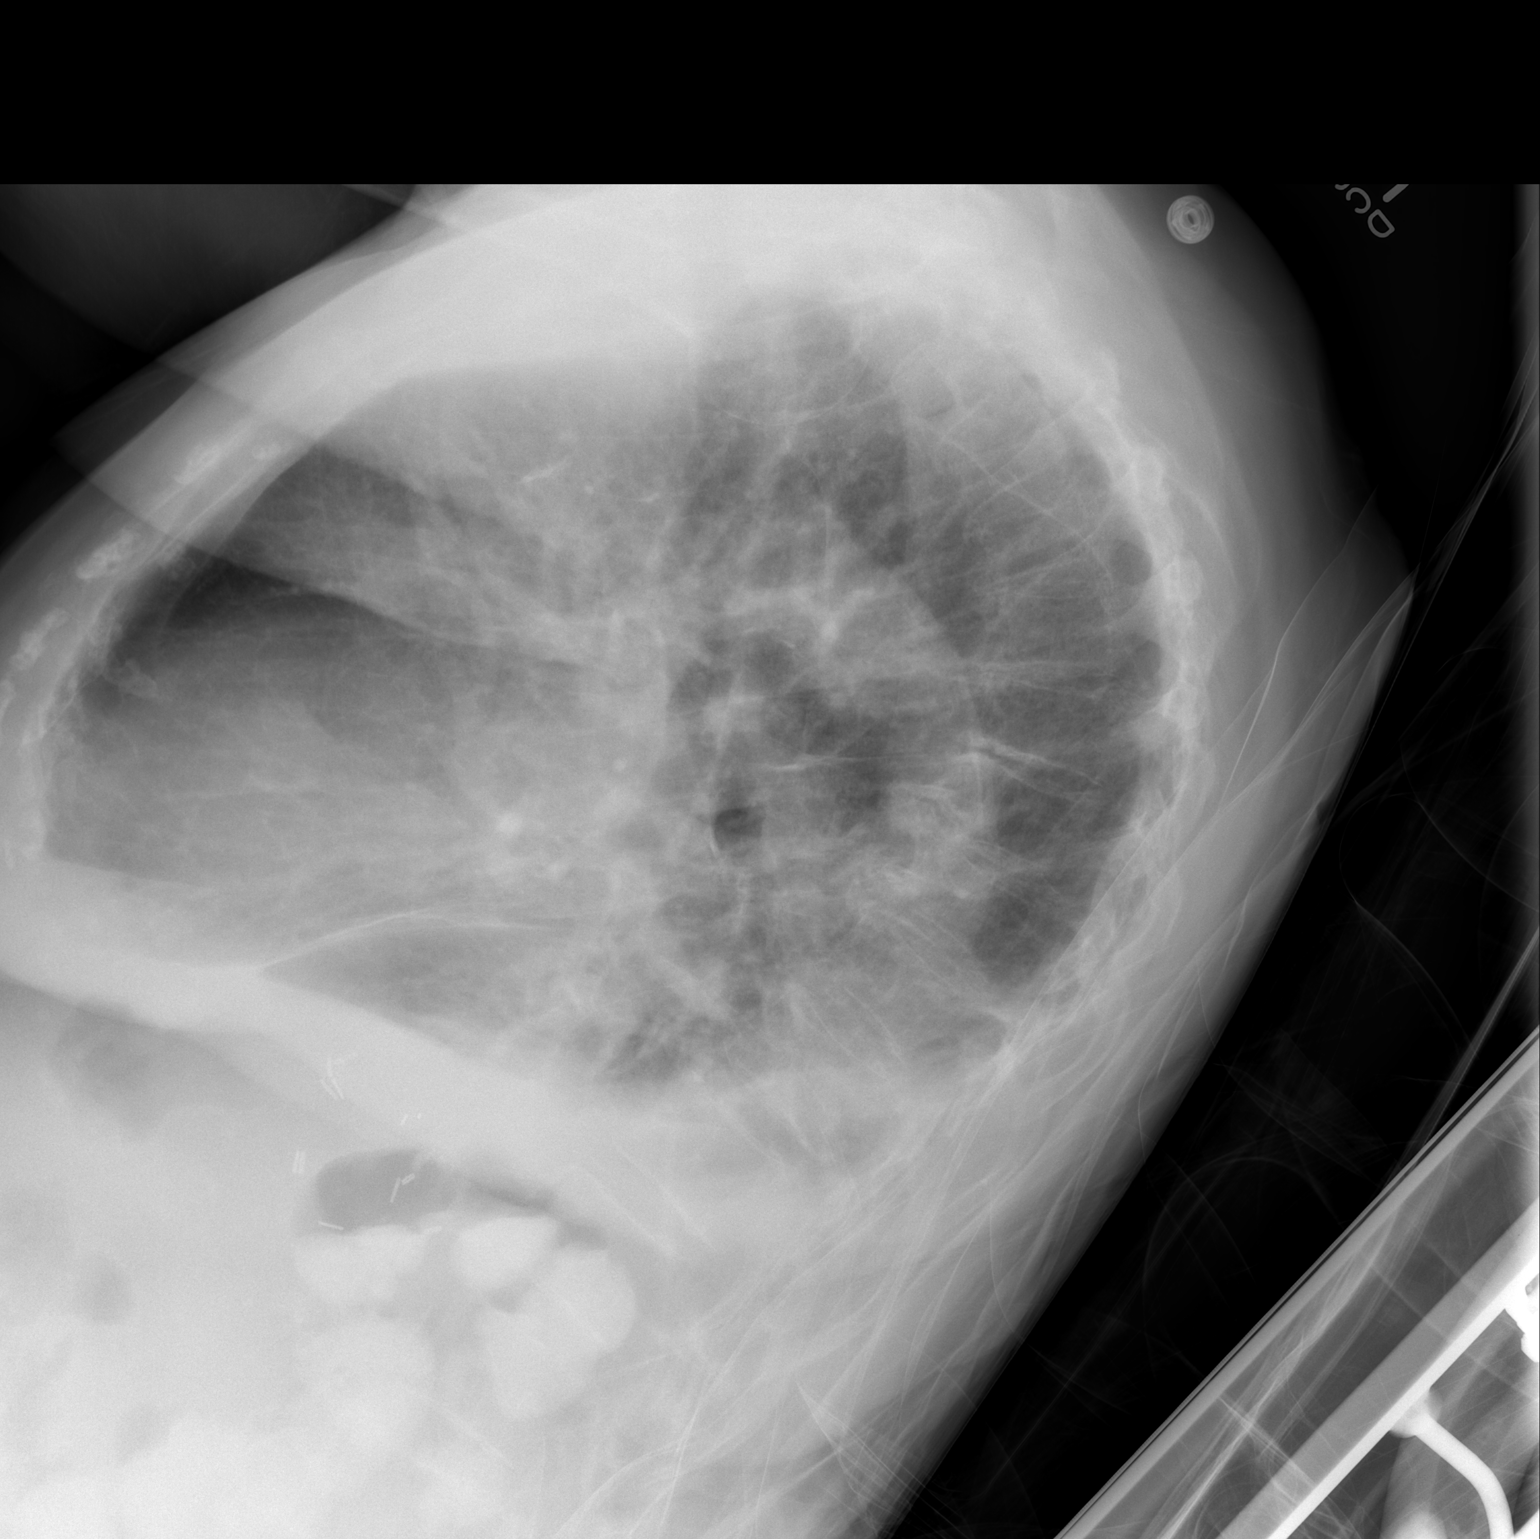

[view not recorded]
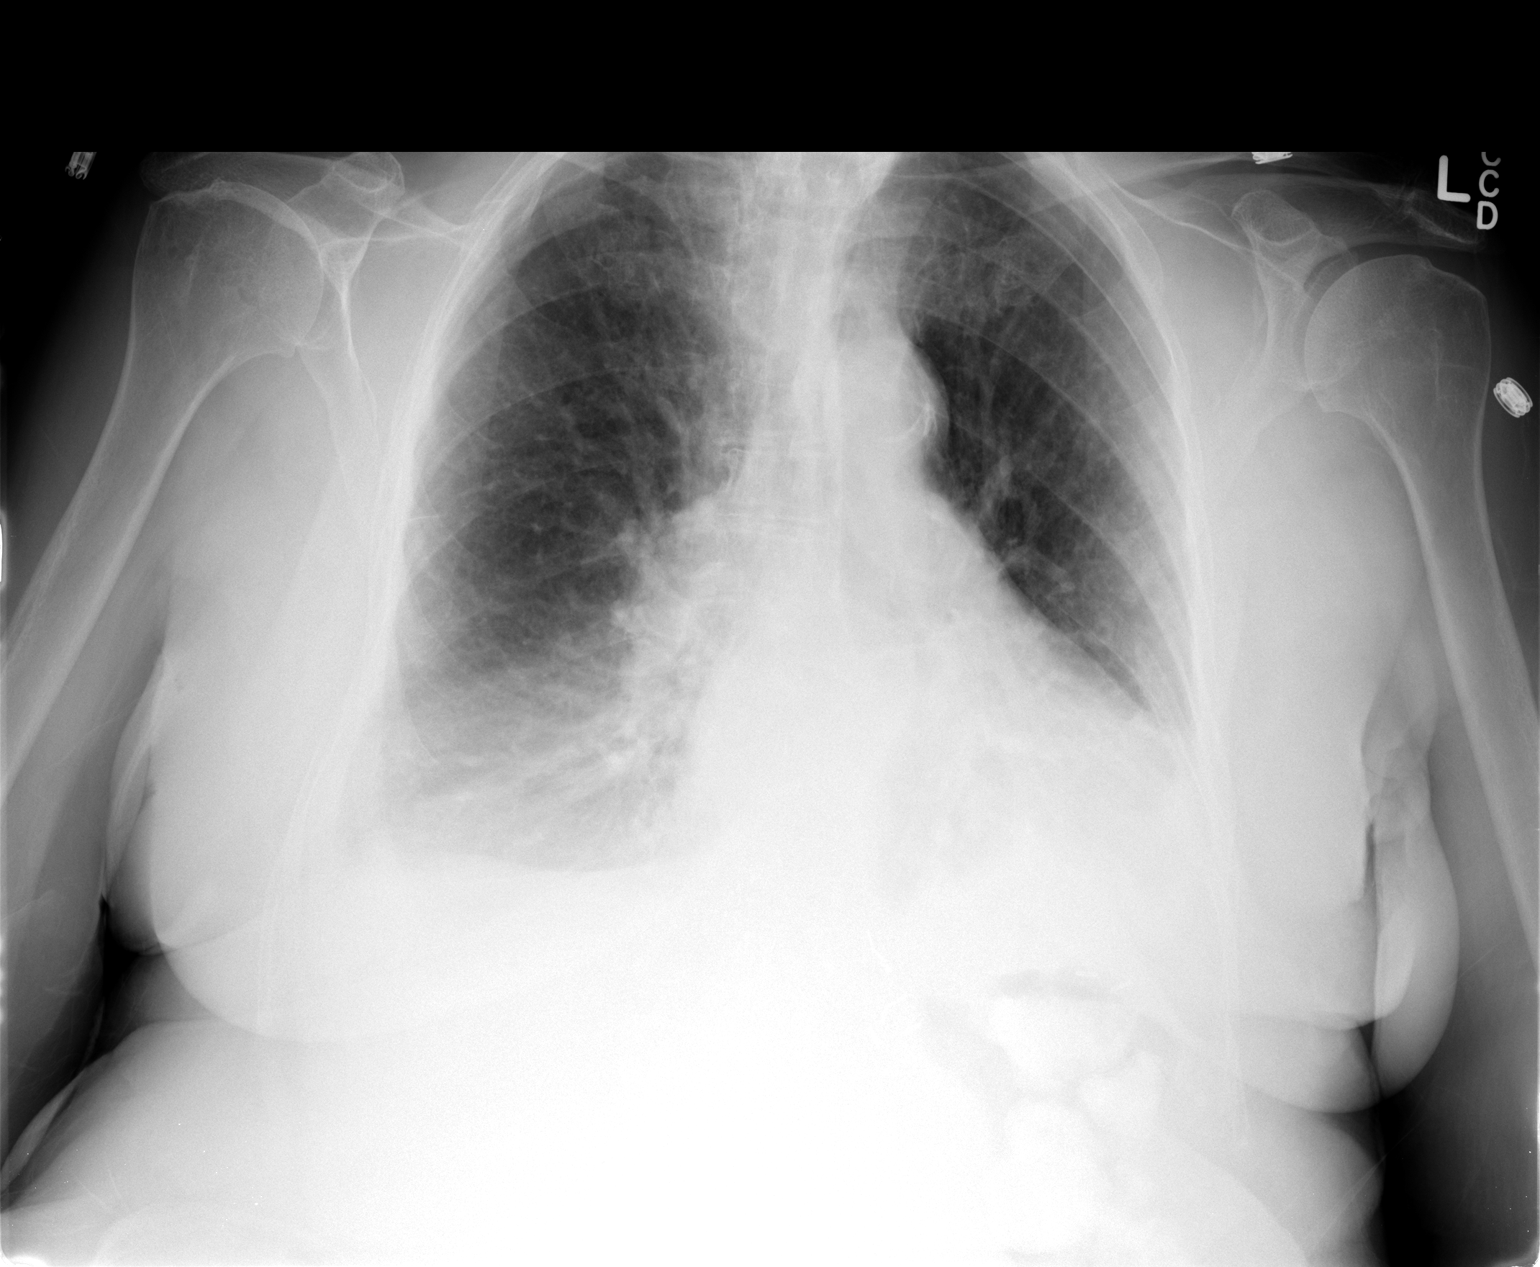

[2 of 2 positions shown; findings below may reference images not displayed]

FINDINGS: Small bilateral pleural effusions show some increase in
size.  There is cardiomegaly and pulmonary vascular congestion
without frank edema.  Bibasilar airspace opacity likely due to
compressive atelectasis and greater on the left is unchanged.
IMPRESSION: 1.  Some increase in small bilateral pleural effusions.
2.  Bibasilar airspace opacity likely due to atelectasis.
3.  Cardiomegaly without edema.

## 2011-11-27 ENCOUNTER — Other Ambulatory Visit (INDEPENDENT_AMBULATORY_CARE_PROVIDER_SITE_OTHER): Payer: Medicare Other | Admitting: *Deleted

## 2011-11-27 DIAGNOSIS — I119 Hypertensive heart disease without heart failure: Secondary | ICD-10-CM

## 2011-11-27 LAB — BASIC METABOLIC PANEL
CO2: 26 mEq/L (ref 19–32)
Calcium: 8.8 mg/dL (ref 8.4–10.5)
Chloride: 101 mEq/L (ref 96–112)
Glucose, Bld: 94 mg/dL (ref 70–99)
Sodium: 135 mEq/L (ref 135–145)

## 2011-11-27 NOTE — Progress Notes (Signed)
Quick Note:  Please make copy of labs for patient visit. ______ 

## 2011-11-28 ENCOUNTER — Ambulatory Visit (INDEPENDENT_AMBULATORY_CARE_PROVIDER_SITE_OTHER): Payer: Medicare Other | Admitting: Cardiology

## 2011-11-28 ENCOUNTER — Other Ambulatory Visit: Payer: Medicare Other | Admitting: *Deleted

## 2011-11-28 ENCOUNTER — Encounter: Payer: Self-pay | Admitting: Cardiology

## 2011-11-28 ENCOUNTER — Ambulatory Visit
Admission: RE | Admit: 2011-11-28 | Discharge: 2011-11-28 | Disposition: A | Discharge status: Home / Self Care | Payer: Medicare Other | Source: Ambulatory Visit | Attending: Cardiology | Admitting: Cardiology

## 2011-11-28 VITALS — BP 140/76 | HR 70 | Resp 18 | Ht 61.0 in | Wt 151.1 lb

## 2011-11-28 DIAGNOSIS — I259 Chronic ischemic heart disease, unspecified: Secondary | ICD-10-CM

## 2011-11-28 DIAGNOSIS — I119 Hypertensive heart disease without heart failure: Secondary | ICD-10-CM

## 2011-11-28 DIAGNOSIS — R0600 Dyspnea, unspecified: Secondary | ICD-10-CM | POA: Insufficient documentation

## 2011-11-28 DIAGNOSIS — E7800 Pure hypercholesterolemia, unspecified: Secondary | ICD-10-CM

## 2011-11-28 DIAGNOSIS — N390 Urinary tract infection, site not specified: Secondary | ICD-10-CM

## 2011-11-28 DIAGNOSIS — R0989 Other specified symptoms and signs involving the circulatory and respiratory systems: Secondary | ICD-10-CM

## 2011-11-28 NOTE — Assessment & Plan Note (Signed)
She is not having any dysuria but does have urinary frequency and has nocturia.  She does try to drink plenty of water to keep her bladder from getting infected and she will also use cranberry juice when necessary

## 2011-11-28 NOTE — Assessment & Plan Note (Signed)
The patient has had no symptoms of angina pectoris.  He has been more short of breath.  She has gained 4 pounds since last visit.

## 2011-11-28 NOTE — Patient Instructions (Signed)
Will have you go for chest xray today and call you with the results Your physician recommends that you continue on your current medications as directed. Please refer to the Current Medication list given to you today. Your physician recommends that you schedule a follow-up appointment in: 3 months with fasting labs (LP/BMET/HFP/CBC)  And EKG

## 2011-11-28 NOTE — Assessment & Plan Note (Signed)
The patient is having increased exertional dyspnea.  She has been eating out with friends a lot restaurants and her salt intake has undoubtedly been higher.  She does have pedal edema today.  She will take an extra Lasix today to try to get rid of the 4 pounds of extra fluid she is carrying.

## 2011-11-28 NOTE — Progress Notes (Signed)
[name] Date of Birth:  07-26-1916 [facility] [address] [place], [place]  [address] [phone]         Fax   [phone]  History of Present Illness: This pleasant 78 year old woman is seen for a scheduled four-month followup office visit.  She has a history of a subendocardial myocardial infarction in 2008.  It was in the setting of an acute pneumonia.  She had a subsequent outpatient nuclear stress test in May 2008 which showed no evidence of ischemia.  She does have a known right bundle branch block.  An echocardiogram in 2008 showing ejection fraction 55-60% with mild to moderate aortic stenosis.  She has a history of mild diastolic congestive heart failure controlled with low-dose furosemide.  Current Outpatient Prescriptions  Medication Sig Dispense Refill  . aspirin 81 MG tablet Take 81 mg by mouth daily.        . Calcium Carbonate-Vitamin D (CALCIUM 600 + D PO) Take 1 tablet by mouth daily.        . fluticasone (FLONASE) 50 MCG/ACT nasal spray Place 2 sprays into the nose daily.  16 g  5  . furosemide (LASIX) 40 MG tablet Take 0.5 tablets (20 mg total) by mouth every other day. Or as directed  30 tablet  11  . losartan (COZAAR) 100 MG tablet TAKE 1 TABLET EVERY DAY  30 tablet  11  . metoprolol succinate (TOPROL-XL) 25 MG 24 hr tablet Take 1 tablet (25 mg total) by mouth daily.  90 tablet  3  . Multiple Vitamin (MULTIVITAMIN) tablet Take 1 tablet by mouth daily.        . nitroGLYCERIN (NITROSTAT) 0.4 MG SL tablet Place 0.4 mg under the tongue every 5 (five) minutes as needed.          Allergies  Allergen Reactions  . Lipitor (Atorvastatin Calcium)     MUSCLE CRAMPS  . Neosporin (Neomycin-Polymyx-Gramicid)   . Prednisone   . Sulfa Antibiotics   . Penicillins Rash    Patient Active Problem List  Diagnoses  . Hyponatremia  . Hypertension  . Hypercholesterolemia  . Status post splenectomy  . Benign hypertensive heart disease without  heart failure  . Ischemic heart disease  . Urinary tract infection  . Dyspnea    History  Smoking status  . Never Smoker   Smokeless tobacco  . Not on file    History  Alcohol Use No    No family history on file.  Review of Systems: Constitutional: no fever chills diaphoresis or fatigue or change in weight.  Head and neck: no hearing loss, no epistaxis, no photophobia or visual disturbance. Respiratory: No cough, shortness of breath or wheezing. Cardiovascular: No chest pain peripheral edema, palpitations. Gastrointestinal: No abdominal distention, no abdominal pain, no change in bowel habits hematochezia or melena. Genitourinary: No dysuria, no frequency, no urgency, no nocturia. Musculoskeletal:No arthralgias, no back pain, no gait disturbance or myalgias. Neurological: No dizziness, no headaches, no numbness, no seizures, no syncope, no weakness, no tremors. Hematologic: No lymphadenopathy, no easy bruising. Psychiatric: No confusion, no hallucinations, no sleep disturbance.    Physical Exam: Filed Vitals:   11/28/11 0940  BP: 140/76  Pulse: 70  Resp: 18   general appearance reveals a well-developed well-nourished elderly woman in no distress.Pupils equal and reactive.   Extraocular Movements are full.  There is no scleral icterus.  The mouth and pharynx are normal.  The neck is supple.  The carotids reveal  no bruits.  The jugular venous pressure is normal.  The thyroid is not enlarged.  There is no lymphadenopathy.  The chest is clear to percussion and auscultation. There are no rales or rhonchi. Expansion of the chest is symmetrical.  The heart reveals a soft systolic ejection murmur at the base.  No diastolic murmur.  No gallop or rubThe abdomen is soft and nontender. Bowel sounds are normal. The liver and spleen are not enlarged. There Are no abdominal masses. There are no bruits.  Extremities show 2+ soft ankle edema.The skin is warm and dry.  There is no  rash. Strength is normal and symmetrical in all extremities.  There is no lateralizing weakness.  There are no sensory deficits.     Assessment / Plan:  Continue same medication except take an extra full Lasix tomorrow.  We are checking a chest x-ray today to look at heart size and pulmonary vascular congestion.  Recheck in 3 months for followup office visit EKG and fasting lipid panel hepatic function panel and basal metabolic panel and CBC.

## 2011-11-29 ENCOUNTER — Telehealth: Payer: Self-pay | Admitting: *Deleted

## 2011-11-29 NOTE — Telephone Encounter (Signed)
Message copied by Burnell Blanks on Wed Nov 29, 2011 12:07 PM ------      Message from: Cassell Clement      Created: Tue Nov 28, 2011  2:13 PM       Please report.  Chest xray is stable and improved since prior xray.

## 2011-11-29 NOTE — Telephone Encounter (Signed)
Advised of chest xray results 

## 2011-12-29 ENCOUNTER — Telehealth: Payer: Self-pay | Admitting: Cardiology

## 2011-12-29 MED ORDER — AZITHROMYCIN 250 MG PO TABS: ORAL_TABLET | ORAL | Status: DC

## 2011-12-29 NOTE — Telephone Encounter (Signed)
Continue Mucinex.  Add  Zithromax Z-Pak

## 2011-12-29 NOTE — Telephone Encounter (Signed)
Advised daughter 

## 2011-12-29 NOTE — Telephone Encounter (Signed)
Mostly nonproductive cough but does get some yellow sputum up at times.  Has only taken 3 tablets so far.  Denies any fever, but did have chills when she first started getting sick.  Uses [facility] on [address].  Will forward to  Dr. Patty Sermons for review

## 2011-12-29 NOTE — Telephone Encounter (Signed)
New msg Pt's daughter wants to know what she can take for cold/congestion. She said she is taking mucinex. Please call

## 2012-01-01 ENCOUNTER — Telehealth: Payer: Self-pay | Admitting: Cardiology

## 2012-01-01 ENCOUNTER — Emergency Department (HOSPITAL_COMMUNITY): Payer: Medicare Other

## 2012-01-01 ENCOUNTER — Inpatient Hospital Stay (HOSPITAL_COMMUNITY)
Admission: EM | Admit: 2012-01-01 | Discharge: 2012-01-04 | DRG: 641 | Disposition: A | Discharge status: Home / Self Care | Payer: Medicare Other | Attending: Internal Medicine | Admitting: Internal Medicine

## 2012-01-01 ENCOUNTER — Encounter (HOSPITAL_COMMUNITY): Payer: Self-pay | Admitting: *Deleted

## 2012-01-01 ENCOUNTER — Other Ambulatory Visit: Payer: Self-pay

## 2012-01-01 DIAGNOSIS — R5381 Other malaise: Secondary | ICD-10-CM | POA: Diagnosis present

## 2012-01-01 DIAGNOSIS — E871 Hypo-osmolality and hyponatremia: Principal | ICD-10-CM

## 2012-01-01 DIAGNOSIS — B961 Klebsiella pneumoniae [K. pneumoniae] as the cause of diseases classified elsewhere: Secondary | ICD-10-CM | POA: Diagnosis present

## 2012-01-01 DIAGNOSIS — I259 Chronic ischemic heart disease, unspecified: Secondary | ICD-10-CM

## 2012-01-01 DIAGNOSIS — Z9081 Acquired absence of spleen: Secondary | ICD-10-CM

## 2012-01-01 DIAGNOSIS — E7800 Pure hypercholesterolemia, unspecified: Secondary | ICD-10-CM

## 2012-01-01 DIAGNOSIS — I10 Essential (primary) hypertension: Secondary | ICD-10-CM

## 2012-01-01 DIAGNOSIS — R0600 Dyspnea, unspecified: Secondary | ICD-10-CM

## 2012-01-01 DIAGNOSIS — I119 Hypertensive heart disease without heart failure: Secondary | ICD-10-CM

## 2012-01-01 DIAGNOSIS — IMO0001 Reserved for inherently not codable concepts without codable children: Secondary | ICD-10-CM

## 2012-01-01 DIAGNOSIS — J209 Acute bronchitis, unspecified: Secondary | ICD-10-CM | POA: Diagnosis present

## 2012-01-01 DIAGNOSIS — J40 Bronchitis, not specified as acute or chronic: Secondary | ICD-10-CM

## 2012-01-01 DIAGNOSIS — J440 Chronic obstructive pulmonary disease with acute lower respiratory infection: Secondary | ICD-10-CM | POA: Diagnosis present

## 2012-01-01 DIAGNOSIS — N390 Urinary tract infection, site not specified: Secondary | ICD-10-CM

## 2012-01-01 DIAGNOSIS — I252 Old myocardial infarction: Secondary | ICD-10-CM

## 2012-01-01 DIAGNOSIS — J069 Acute upper respiratory infection, unspecified: Secondary | ICD-10-CM

## 2012-01-01 LAB — URINALYSIS, ROUTINE W REFLEX MICROSCOPIC
Bilirubin Urine: NEGATIVE
Glucose, UA: NEGATIVE mg/dL
Hgb urine dipstick: NEGATIVE
Ketones, ur: NEGATIVE mg/dL
Protein, ur: NEGATIVE mg/dL

## 2012-01-01 LAB — CBC
Hemoglobin: 14 g/dL (ref 12.0–15.0)
MCHC: 34.3 g/dL (ref 30.0–36.0)
RBC: 4.68 MIL/uL (ref 3.87–5.11)
RDW: 13 % (ref 11.5–15.5)

## 2012-01-01 LAB — BASIC METABOLIC PANEL
BUN: 11 mg/dL (ref 6–23)
CO2: 24 mEq/L (ref 19–32)
Calcium: 8.8 mg/dL (ref 8.4–10.5)
Calcium: 8.2 mg/dL — ABNORMAL LOW (ref 8.4–10.5)
Creatinine, Ser: 0.72 mg/dL (ref 0.50–1.10)
GFR calc non Af Amer: 70 mL/min — ABNORMAL LOW (ref >90)
Glucose, Bld: 102 mg/dL — ABNORMAL HIGH (ref 70–99)
Glucose, Bld: 126 mg/dL — ABNORMAL HIGH (ref 70–99)

## 2012-01-01 LAB — DIFFERENTIAL
Basophils Absolute: 0 10*3/uL (ref 0.0–0.1)
Eosinophils Absolute: 0.1 10*3/uL (ref 0.0–0.7)
Eosinophils Relative: 1 % (ref 0–5)
Lymphocytes Relative: 45 % (ref 12–46)
Monocytes Absolute: 1 10*3/uL (ref 0.1–1.0)

## 2012-01-01 LAB — CARDIAC PANEL(CRET KIN+CKTOT+MB+TROPI)
CK, MB: 1.5 ng/mL (ref 0.3–4.0)
Troponin I: <0.3 ng/mL (ref <0.30)

## 2012-01-01 MED ORDER — ALBUTEROL SULFATE (5 MG/ML) 0.5% IN NEBU
5.0000 mg | INHALATION_SOLUTION | Freq: Once | RESPIRATORY_TRACT | Status: AC
  Administered 2012-01-01: 5 mg via RESPIRATORY_TRACT
  Filled 2012-01-01: qty 1

## 2012-01-01 MED ORDER — FLUTICASONE PROPIONATE 50 MCG/ACT NA SUSP
2.0000 | Freq: Every day | NASAL | Status: DC
  Administered 2012-01-02 – 2012-01-04 (×3): 2 via NASAL
  Filled 2012-01-01: qty 16

## 2012-01-01 MED ORDER — AZITHROMYCIN 250 MG PO TABS
250.0000 mg | ORAL_TABLET | Freq: Every day | ORAL | Status: AC
  Administered 2012-01-02 – 2012-01-03 (×2): 250 mg via ORAL
  Filled 2012-01-01 (×2): qty 1

## 2012-01-01 MED ORDER — ENOXAPARIN SODIUM 40 MG/0.4ML ~~LOC~~ SOLN
40.0000 mg | SUBCUTANEOUS | Status: DC
  Administered 2012-01-01 – 2012-01-03 (×3): 40 mg via SUBCUTANEOUS
  Filled 2012-01-01 (×5): qty 0.4

## 2012-01-01 MED ORDER — HYDROCODONE-ACETAMINOPHEN 5-325 MG PO TABS: 1.0000 | ORAL_TABLET | Freq: Four times a day (QID) | ORAL | Status: DC | PRN

## 2012-01-01 MED ORDER — ASPIRIN EC 81 MG PO TBEC
81.0000 mg | DELAYED_RELEASE_TABLET | Freq: Every day | ORAL | Status: DC
  Administered 2012-01-02 – 2012-01-04 (×3): 81 mg via ORAL
  Filled 2012-01-01 (×4): qty 1

## 2012-01-01 MED ORDER — SODIUM CHLORIDE 0.9 % IV SOLN
INTRAVENOUS | Status: DC
  Administered 2012-01-02 – 2012-01-04 (×4): via INTRAVENOUS

## 2012-01-01 MED ORDER — NITROGLYCERIN 0.4 MG SL SUBL: 0.4000 mg | SUBLINGUAL_TABLET | SUBLINGUAL | Status: DC | PRN

## 2012-01-01 MED ORDER — ACETAMINOPHEN 650 MG RE SUPP: 650.0000 mg | Freq: Four times a day (QID) | RECTAL | Status: DC | PRN

## 2012-01-01 MED ORDER — SODIUM CHLORIDE 0.9 % IJ SOLN
3.0000 mL | Freq: Two times a day (BID) | INTRAMUSCULAR | Status: DC
  Administered 2012-01-02 – 2012-01-03 (×2): 3 mL via INTRAVENOUS

## 2012-01-01 MED ORDER — ADULT MULTIVITAMIN W/MINERALS CH
1.0000 | ORAL_TABLET | Freq: Every day | ORAL | Status: DC
  Administered 2012-01-02 – 2012-01-04 (×3): 1 via ORAL
  Filled 2012-01-01 (×4): qty 1

## 2012-01-01 MED ORDER — SODIUM CHLORIDE 0.9 % IV SOLN
INTRAVENOUS | Status: DC
  Administered 2012-01-01: 14:00:00 via INTRAVENOUS

## 2012-01-01 MED ORDER — LOSARTAN POTASSIUM 50 MG PO TABS
100.0000 mg | ORAL_TABLET | Freq: Every day | ORAL | Status: DC
  Administered 2012-01-02 – 2012-01-04 (×3): 100 mg via ORAL
  Filled 2012-01-01 (×4): qty 2

## 2012-01-01 MED ORDER — ACETAMINOPHEN 325 MG PO TABS: 650.0000 mg | ORAL_TABLET | Freq: Four times a day (QID) | ORAL | Status: DC | PRN

## 2012-01-01 MED ORDER — ALBUTEROL SULFATE (5 MG/ML) 0.5% IN NEBU: 2.5000 mg | INHALATION_SOLUTION | RESPIRATORY_TRACT | Status: AC | PRN

## 2012-01-01 MED ORDER — GUAIFENESIN-DM 100-10 MG/5ML PO SYRP
5.0000 mL | ORAL_SOLUTION | ORAL | Status: DC | PRN
  Administered 2012-01-01 – 2012-01-02 (×6): 5 mL via ORAL
  Administered 2012-01-03: 11:00:00 via ORAL
  Administered 2012-01-03 – 2012-01-04 (×2): 5 mL via ORAL
  Filled 2012-01-01 (×9): qty 10

## 2012-01-01 MED ORDER — SODIUM CHLORIDE 0.9 % IV SOLN: INTRAVENOUS | Status: AC

## 2012-01-01 MED ORDER — GUAIFENESIN ER 600 MG PO TB12
600.0000 mg | ORAL_TABLET | Freq: Two times a day (BID) | ORAL | Status: DC
  Administered 2012-01-01 – 2012-01-04 (×6): 600 mg via ORAL
  Filled 2012-01-01 (×10): qty 1

## 2012-01-01 MED ORDER — LORATADINE 10 MG PO TABS
10.0000 mg | ORAL_TABLET | Freq: Every day | ORAL | Status: DC
  Administered 2012-01-02 – 2012-01-04 (×3): 10 mg via ORAL
  Filled 2012-01-01 (×4): qty 1

## 2012-01-01 MED ORDER — METOPROLOL SUCCINATE ER 25 MG PO TB24
25.0000 mg | ORAL_TABLET | Freq: Every day | ORAL | Status: DC
  Administered 2012-01-02 – 2012-01-04 (×3): 25 mg via ORAL
  Filled 2012-01-01 (×4): qty 1

## 2012-01-01 NOTE — Telephone Encounter (Signed)
New Msg: Pt daughter calling wanting to speak with nurse/MD about contemplating  taking pt to ER due to pt inability to catch breath, nausea, and pain in back. Pt symptoms have been present since last Wednesday. Pt has been taking musinex since Thursday for chest congestion and added a z-pak Friday. Pt has had no relief with medication. Pt symptoms have gotten worse as oppose to better. Pt daughter would like to speak with nurse about pt current health condition. Please return pt daughter call to discuss further.

## 2012-01-01 NOTE — ED Notes (Signed)
Called the floor to give report, negie Sec reports that receiving nurse Irving Burton just received a pt and will return call

## 2012-01-01 NOTE — ED Notes (Signed)
Bed:RESA<BR> Expected date:<BR> Expected time:<BR> Means of arrival:<BR> Comments:<BR> Ems/ sob

## 2012-01-01 NOTE — Telephone Encounter (Signed)
Spoke with daughter, patient is wheezing and "sick".  Daughter states they are wanting to take her by EMS to the hospital but wanted  Dr. Patty Sermons to call ahead so she wouldn't have to wait so long in ED.  Advised she needed to go ahead and call 911 & get her evaluated

## 2012-01-01 NOTE — H&P (Signed)
PCP:  Dr Patty Sermons  Chief Complaint:  Dyspnea, and fatigue  HPI: The patient is a 78 year old right female who lives at home with history of hyponatremia in the past (but had last sodium November of 2012 136), hypertension, ischemic heart disease/MI in 2008 who presents with above complaints. She states that she has had a URI/cold symptoms for the past 5 days, and her primary care physician Dr. Patty Sermons was treating it with Zithromax and Mucinex but she has progressively become dyspneic. She reports that initially was mostly the cough but she began having some nasal congestion and the dyspnea. She leg swelling, also denies PND. She states that today and she began having a vague chest tightness-in the upper chest area close to her neck. She admits to nausea but no vomiting. She was seen in the ED and a chest x-ray was done and showed findings consistent with COPD, mild atelectasis, scarring, pleural effusion improved from the prior study. Labs done in the ED revealed a sodium of 125. She is admitted for further evaluation and management. She denies diarrhea, dysuria, and no focal weakness.  Review of Systems:  The patient denies anorexia, fever, weight loss,, vision loss, decreased hearing, hoarseness, chest pain, syncope,on exertion, peripheral edema, balance deficits, hemoptysis, abdominal pain, melena, hematochezia, severe indigestion/heartburn, hematuria, incontinence, genital sores, muscle weakness, transient blindness, difficulty walking, depression, unusual weight change, abnormal bleeding.  Past Medical History: Past Medical History  Diagnosis Date  . HTN (hypertension)   . Hypercholesteremia   . Ischemic heart disease     MI-2008  . Myocardial infarction 2008  . Breast lump     LEFT  . Abdominal fluid collection     likely seroma, evaluated by Interventional Radiology  . Hyponatremia   . Foot pain, left   . Otitis media      Left  . Laryngitis   . Coronary disease   . Dyspnea        Mild dyspnea with left lung infiltrate and leukocytosis and left  posterior otitis media  . Pleural effusion    Past Surgical History  Procedure Date  . Splenectomy   . Hernia repair     Medications: Prior to Admission medications   Medication Sig Start Date End Date Taking? Authorizing Provider  aspirin 81 MG tablet Take 81 mg by mouth daily.     Yes Historical Provider, MD  azithromycin (ZITHROMAX) 250 MG tablet Take 250 mg by mouth daily. Take 2 tablets (500 mg) on  Day 1,  followed by 1 tablet (250 mg) once daily on Days 2 through 5. 12/29/11 01/03/12 Yes Cassell Clement, MD  Calcium Carbonate-Vitamin D (CALCIUM 600 + D PO) Take 1 tablet by mouth daily.     Yes Historical Provider, MD  fluticasone (FLONASE) 50 MCG/ACT nasal spray Place 2 sprays into the nose daily. 08/28/11  Yes Cassell Clement, MD  furosemide (LASIX) 40 MG tablet Take 0.5 tablets (20 mg total) by mouth every other day. Or as directed 09/25/11  Yes Cassell Clement, MD  losartan (COZAAR) 100 MG tablet TAKE 1 TABLET EVERY DAY 01/09/11  Yes Cassell Clement, MD  metoprolol succinate (TOPROL-XL) 25 MG 24 hr tablet Take 1 tablet (25 mg total) by mouth daily. 09/29/11  Yes Cassell Clement, MD  Multiple Vitamin (MULTIVITAMIN) tablet Take 1 tablet by mouth daily.     Yes Historical Provider, MD  nitroGLYCERIN (NITROSTAT) 0.4 MG SL tablet Place 0.4 mg under the tongue every 5 (five) minutes as needed.  Cassell Clement, MD    Allergies:   Allergies  Allergen Reactions  . Lipitor (Atorvastatin Calcium)     MUSCLE CRAMPS  . Neosporin (Neomycin-Polymyx-Gramicid)   . Prednisone   . Sulfa Antibiotics   . Penicillins Rash    Social History:  reports that she has never smoked. She has never used smokeless tobacco. She reports that she does not drink alcohol or use illicit drugs.  Family History: History reviewed. No pertinent family history.  Physical Exam: Filed Vitals:   01/01/12 1035 01/01/12 1147 01/01/12  1229  BP: 165/84    Pulse: 61    Temp: 98.8 F (37.1 C)    TempSrc: Oral    Resp: 22    SpO2: 96% 99% 98%   Constitutional: Vital signs reviewed.  Patient is a well-developed and well-nourished  in no acute distress and cooperative with exam. Alert and oriented x3.  Head: Normocephalic and atraumatic Mouth: no erythema or exudates, slightly dry MM Eyes: PERRL, EOMI, conjunctivae normal, No scleral icterus.  Neck: Supple, Trachea midline normal ROM, No JVD, mass, thyromegaly, or carotid bruit present.  Cardiovascular: RRR, S1 normal, S2 normal, no MRG, pulses symmetric and intact bilaterally Pulmonary/Chest: CTAB, no wheezes, rales, or rhonchi Abdominal: Soft. Non-tender, non-distended, bowel sounds are normal, no masses, organomegaly, or guarding present.  GU: no CVA tenderness  extremities: No edema, no cyanosis  Hematology: no cervical, inginal, or axillary adenopathy.  Neurological: A&O x3, Strenght is normal and symmetric bilaterally, cranial nerve II-XII are grossly intact, no focal motor deficit,  Skin: Warm, dry and intact. No rash, cyanosis, or clubbing.     Labs on Admission:   [facility] 01/01/12 1112  NA 125*  K 4.4  CL 92*  CO2 24  GLUCOSE 102*  BUN 11  CREATININE 0.72  CALCIUM 8.8  MG --  PHOS --   No results found for this basename: AST:2,ALT:2,ALKPHOS:2,BILITOT:2,PROT:2,ALBUMIN:2 in the last 72 hours No results found for this basename: LIPASE:2,AMYLASE:2 in the last 72 hours  Basename 01/01/12 1112  WBC 8.2  NEUTROABS 3.4  HGB 14.0  HCT 40.8  MCV 87.2  PLT 233   No results found for this basename: CKTOTAL:3,CKMB:3,CKMBINDEX:3,TROPONINI:3 in the last 72 hours No results found for this basename: TSH,T4TOTAL,FREET3,T3FREE,THYROIDAB in the last 72 hours No results found for this basename: VITAMINB12:2,FOLATE:2,FERRITIN:2,TIBC:2,IRON:2,RETICCTPCT:2 in the last 72 hours  Radiological Exams on Admission: Dg Chest Port 1 View  01/01/2012  *RADIOLOGY  REPORT*  Clinical Data: Cough.  PORTABLE CHEST - 1 VIEW  Comparison: 11/28/2011.  Findings: The heart is mildly enlarged but stable.  Stable tortuosity and calcification of the thoracic aorta.  Chronic lung changes but no acute overlying pulmonary process.  IMPRESSION: Mild stable cardiac enlargement and chronic bronchitic type lung changes but no acute pulmonary findings.  Original Report Authenticated By: P. Loralie Champagne, M.D.    Assessment/Plan Present on Admission:  .Hyponatremia As discussed above, will empirically start IV fluids as she is not clinically/overtly volume overloaded on exam.it is noted that she has been on Lasix, although her Epic records do not show a prior history of CHF-her last echo was in 2008 per Dr. Yevonne Pax notes with an EF of 55-60%.will obtain a brain natruretic peptide, monitor bmet and further treat accordingly. We'll also obtain a TSH and follow.  Marland KitchenDyspnea -Chest x-ray with no acute infiltrates, will obtain BNP as above cycle cardiac enzymes and follow.  -Will also continue treatment of bronchitis/URI with Zithromax, mucolytics and we'll also add antihistamines.  -Follow, and  further recommendations pending above studies. .Hypertension -Continue outpatient medications.  .Benign hypertensive heart disease without heart failure -Continue outpatient medications, cardiac enzymes as above and BNP as well -follow and treat accordingly.  .Acute URI/bronchitis -As above  Ilona Colley C 01/01/2012, 2:44 PM

## 2012-01-01 NOTE — ED Notes (Signed)
Pt reports SOB with chest tightness and nasal congestion.  Worse with exertion.  Pt denies any hx of lung problems.  Lung sounds clear with mild crackle in L upper lobe.

## 2012-01-01 NOTE — ED Notes (Signed)
Per EMS, pt from home, reports SOB x 1 week, started taking Z-pak-without relief.   Pt is A&OX 4.  EKG WNL.  Denies any pain, reports chest tightness.

## 2012-01-01 NOTE — ED Provider Notes (Signed)
History     CSN: [id number]  Arrival date & time 01/01/12  1030   First MD Initiated Contact with Patient 01/01/12 1056      Chief Complaint  Patient presents with  . Shortness of Breath    (Consider location/radiation/quality/duration/timing/severity/associated sxs/prior treatment) HPI Comments: States she's been battling a URI over the past 5 days or so. Was placed on Mucinex and a Z-Pak without significant relief. An episode of shortness of breath this morning. No chest pain, diaphoresis, nausea, vomiting  Patient is a 78 y.o. female presenting with shortness of breath. The history is provided by the patient. No language interpreter was used.  Shortness of Breath  The current episode started 3 to 5 days ago. The onset was gradual. The problem occurs continuously. The problem has been gradually worsening. The problem is mild. The symptoms are relieved by nothing. The symptoms are aggravated by activity. Associated symptoms include rhinorrhea, cough and shortness of breath. Pertinent negatives include no chest pain, no chest pressure, no orthopnea, no fever, no sore throat and no wheezing. The cough has no precipitants. The cough is dry.    Past Medical History  Diagnosis Date  . HTN (hypertension)   . Hypercholesteremia   . Ischemic heart disease     MI-2008  . Myocardial infarction 2008  . Breast lump     LEFT  . Abdominal fluid collection     likely seroma, evaluated by Interventional Radiology  . Hyponatremia   . Foot pain, left   . Otitis media      Left  . Laryngitis   . Coronary disease   . Dyspnea      Mild dyspnea with left lung infiltrate and leukocytosis and left  posterior otitis media  . Pleural effusion     Past Surgical History  Procedure Date  . Splenectomy   . Hernia repair     No family history on file.  History  Substance Use Topics  . Smoking status: Never Smoker   . Smokeless tobacco: Not on file  . Alcohol Use: No    OB History    Grav Para Term Preterm Abortions TAB SAB Ect Mult Living                  Review of Systems  Constitutional: Positive for activity change and fatigue. Negative for fever, chills and appetite change.  HENT: Positive for congestion and rhinorrhea. Negative for sore throat, neck pain and neck stiffness.   Respiratory: Positive for cough and shortness of breath. Negative for wheezing.   Cardiovascular: Negative for chest pain, palpitations and orthopnea.  Gastrointestinal: Negative for nausea, vomiting and abdominal pain.  Genitourinary: Negative for dysuria, urgency, frequency and flank pain.  Musculoskeletal: Negative for myalgias, back pain and arthralgias.  Neurological: Negative for dizziness, weakness, light-headedness, numbness and headaches.  All other systems reviewed and are negative.    Allergies  Lipitor; Neosporin; Prednisone; Sulfa antibiotics; and Penicillins  Home Medications   Current Outpatient Rx  Name Route Sig Dispense Refill  . ASPIRIN 81 MG PO TABS Oral Take 81 mg by mouth daily.      . AZITHROMYCIN 250 MG PO TABS Oral Take 250 mg by mouth daily. Take 2 tablets (500 mg) on  Day 1,  followed by 1 tablet (250 mg) once daily on Days 2 through 5.    . CALCIUM 600 + D PO Oral Take 1 tablet by mouth daily.      Marland Kitchen FLUTICASONE PROPIONATE 50  MCG/ACT NA SUSP Nasal Place 2 sprays into the nose daily. 16 g 5  . FUROSEMIDE 40 MG PO TABS Oral Take 0.5 tablets (20 mg total) by mouth every other day. Or as directed 30 tablet 11  . LOSARTAN POTASSIUM 100 MG PO TABS  TAKE 1 TABLET EVERY DAY 30 tablet 11  . METOPROLOL SUCCINATE ER 25 MG PO TB24 Oral Take 1 tablet (25 mg total) by mouth daily. 90 tablet 3  . ONE-DAILY MULTI VITAMINS PO TABS Oral Take 1 tablet by mouth daily.      Marland Kitchen NITROGLYCERIN 0.4 MG SL SUBL Sublingual Place 0.4 mg under the tongue every 5 (five) minutes as needed.        BP 165/84  Pulse 61  Temp(Src) 98.8 F (37.1 C) (Oral)  Resp 22  SpO2 98%  Physical  Exam  Nursing note and vitals reviewed. Constitutional: She is oriented to person, place, and time. She appears well-developed and well-nourished. No distress.  HENT:  Head: Normocephalic and atraumatic.  Mouth/Throat: Oropharynx is clear and moist.  Eyes: Conjunctivae and EOM are normal. Pupils are equal, round, and reactive to light.  Neck: Normal range of motion. Neck supple.  Cardiovascular: Normal rate, regular rhythm, normal heart sounds and intact distal pulses.  Exam reveals no gallop and no friction rub.   No murmur heard. Pulmonary/Chest: Effort normal. No respiratory distress. She exhibits no tenderness.       Coarse rhonchi scattered bilaterally  Abdominal: Soft. Bowel sounds are normal. She exhibits no distension. There is no tenderness.  Musculoskeletal: Normal range of motion. She exhibits no edema and no tenderness.  Neurological: She is alert and oriented to person, place, and time. No cranial nerve deficit.  Skin: Skin is warm and dry. No rash noted.    ED Course  Procedures (including critical care time)   Date: 01/01/2012  Rate: 65  Rhythm: normal sinus rhythm  QRS Axis: normal  Intervals: normal  ST/T Wave abnormalities: nonspecific ST/T changes  Conduction Disutrbances:right bundle branch block  Narrative Interpretation:   Old EKG Reviewed: unchanged  Labs Reviewed  DIFFERENTIAL - Abnormal; Notable for the following:    Neutrophils Relative 41 (*)    Monocytes Relative 13 (*)    All other components within normal limits  BASIC METABOLIC PANEL - Abnormal; Notable for the following:    Sodium 125 (*)    Chloride 92 (*)    Glucose, Bld 102 (*)    GFR calc non Af Amer 71 (*)    GFR calc Af Amer 83 (*)    All other components within normal limits  CBC   Dg Chest Port 1 View  01/01/2012  *RADIOLOGY REPORT*  Clinical Data: Cough.  PORTABLE CHEST - 1 VIEW  Comparison: 11/28/2011.  Findings: The heart is mildly enlarged but stable.  Stable tortuosity and  calcification of the thoracic aorta.  Chronic lung changes but no acute overlying pulmonary process.  IMPRESSION: Mild stable cardiac enlargement and chronic bronchitic type lung changes but no acute pulmonary findings.  Original Report Authenticated By: P. Loralie Champagne, M.D.     1. Hyponatremia   2. Bronchitis       MDM  Hyponatremia. Shortness of breath likely secondary to viral process. Symptoms improved with albuterol. She'll require admission for IV fluids for her hyponatremia. Discussed with the [facility] hospitalist who accepted the patient for admission        Dayton Bailiff, MD 01/01/12 1243

## 2012-01-01 NOTE — ED Notes (Signed)
Family at bedside. 

## 2012-01-01 NOTE — ED Notes (Signed)
Dee RT contacted to start pt on breathing tx 

## 2012-01-02 LAB — CREATININE, URINE, RANDOM: Creatinine, Urine: 79.8 mg/dL

## 2012-01-02 LAB — CARDIAC PANEL(CRET KIN+CKTOT+MB+TROPI)
CK, MB: 1.6 ng/mL (ref 0.3–4.0)
Relative Index: INVALID (ref 0.0–2.5)
Total CK: 39 U/L (ref 7–177)

## 2012-01-02 LAB — BASIC METABOLIC PANEL
BUN: 12 mg/dL (ref 6–23)
Chloride: 96 mEq/L (ref 96–112)
Creatinine, Ser: 0.76 mg/dL (ref 0.50–1.10)
GFR calc Af Amer: 81 mL/min — ABNORMAL LOW (ref >90)
Glucose, Bld: 96 mg/dL (ref 70–99)

## 2012-01-02 LAB — OSMOLALITY: Osmolality: 265 mOsm/kg — ABNORMAL LOW (ref 275–300)

## 2012-01-02 LAB — OSMOLALITY, URINE: Osmolality, Ur: 402 mOsm/kg (ref 390–1090)

## 2012-01-02 MED ORDER — LEVALBUTEROL HCL 0.63 MG/3ML IN NEBU
0.6300 mg | INHALATION_SOLUTION | RESPIRATORY_TRACT | Status: DC | PRN
  Administered 2012-01-03 – 2012-01-04 (×2): 0.63 mg via RESPIRATORY_TRACT
  Filled 2012-01-02: qty 3

## 2012-01-02 MED ORDER — BUDESONIDE-FORMOTEROL FUMARATE 160-4.5 MCG/ACT IN AERO
2.0000 | INHALATION_SPRAY | Freq: Two times a day (BID) | RESPIRATORY_TRACT | Status: DC
  Administered 2012-01-02 – 2012-01-04 (×4): 2 via RESPIRATORY_TRACT
  Filled 2012-01-02: qty 6

## 2012-01-02 MED ORDER — TIOTROPIUM BROMIDE MONOHYDRATE 18 MCG IN CAPS
18.0000 ug | ORAL_CAPSULE | Freq: Every day | RESPIRATORY_TRACT | Status: DC
  Administered 2012-01-02 – 2012-01-04 (×3): 18 ug via RESPIRATORY_TRACT
  Filled 2012-01-02: qty 5

## 2012-01-02 NOTE — Progress Notes (Signed)
Subjective: She feels much better today, decreased dyspnea on exertion, cough and decreased weakness. Tolerating by mouth well-no nausea or vomiting. Objective: Vital signs in last 24 hours: Temp:  [97.4 F (36.3 C)-98.2 F (36.8 C)] 97.4 F (36.3 C) (03/26 1431) Pulse Rate:  [64-78] 64  (03/26 1431) Resp:  [16-18] 18  (03/26 1431) BP: (124-136)/(72-83) 136/83 mmHg (03/26 1431) SpO2:  [95 %-99 %] 99 % (03/26 1724) Weight:  [69 kg (152 lb 1.9 oz)] 69 kg (152 lb 1.9 oz) (03/26 0623) Last BM Date: 01/01/12 Intake/Output from previous day: 03/25 0701 - 03/26 0700 In: 1365 [P.O.:240; I.V.:1125] Out: 900 [Urine:900] Intake/Output this shift: Total I/O In: 600 [I.V.:600] Out: 925 [Urine:925]    General Appearance:    Alert, cooperative, no distress, appears stated age  Lungs:     moderate air movement, no wheezes respirations unlabored   Heart:    Regular rate and rhythm, S1 and S2 normal, no murmur, rub   or gallop  Abdomen:     Soft, non-tender, bowel sounds active all four quadrants,    no masses, no organomegaly  Extremities:   Extremities normal, atraumatic, no cyanosis or edema  Neurologic:   CNII-XII intact, normal strength, nonfocal.        Weight change:   Intake/Output Summary (Last 24 hours) at 01/02/12 1844 Last data filed at 01/02/12 1500  Gross per 24 hour  Intake   1725 ml  Output   1425 ml  Net    300 ml    Lab Results:   Basename 01/02/12 0750 01/01/12 1852  NA 128* 126*  K 4.2 3.8  CL 96 94*  CO2 25 25  GLUCOSE 96 126*  BUN 12 11  CREATININE 0.76 0.76  CALCIUM 8.2* 8.2*    Basename 01/01/12 1112  WBC 8.2  HGB 14.0  HCT 40.8  PLT 233  MCV 87.2   PT/INR No results found for this basename: LABPROT:2,INR:2 in the last 72 hours ABG No results found for this basename: PHART:2,PCO2:2,PO2:2,HCO3:2 in the last 72 hours  Micro Results: No results found for this or any previous visit (from the past 240 hour(s)). Studies/Results: Dg Chest Port  1 View  01/01/2012  *RADIOLOGY REPORT*  Clinical Data: Cough.  PORTABLE CHEST - 1 VIEW  Comparison: 11/28/2011.  Findings: The heart is mildly enlarged but stable.  Stable tortuosity and calcification of the thoracic aorta.  Chronic lung changes but no acute overlying pulmonary process.  IMPRESSION: Mild stable cardiac enlargement and chronic bronchitic type lung changes but no acute pulmonary findings.  Original Report Authenticated By: P. Loralie Champagne, M.D.   Medications:  Scheduled Meds:   . sodium chloride   Intravenous STAT  . aspirin EC  81 mg Oral Daily  . azithromycin  250 mg Oral Daily  . budesonide-formoterol  2 puff Inhalation BID  . enoxaparin  40 mg Subcutaneous Q24H  . fluticasone  2 spray Each Nare Daily  . guaiFENesin  600 mg Oral BID  . loratadine  10 mg Oral Daily  . losartan  100 mg Oral Daily  . metoprolol succinate  25 mg Oral Daily  . mulitivitamin with minerals  1 tablet Oral Daily  . sodium chloride  3 mL Intravenous Q12H  . tiotropium  18 mcg Inhalation Daily   Continuous Infusions:   . sodium chloride 75 mL/hr at 01/02/12 1403   PRN Meds:.acetaminophen, acetaminophen, albuterol, guaiFENesin-dextromethorphan, HYDROcodone-acetaminophen, levalbuterol, nitroGLYCERIN Assessment/Plan: Patient Active Hospital Problem List: Hyponatremia (01/05/2011)  -improving with  hydration, urine lytes and very helpful as patient was on Lasix prior to admission. Follow.  Hypertension (01/05/2011)   okay control on outpatient medications, follow.  H/o Benign hypertensive heart disease without heart failure (04/03/2011) -BNP mildly elevated but not overtly volume overloaded on exam.  -Monitor of closely, Lasix when necessary as appropriate.    Dyspnea/acute bronchitis on COPD (11/28/2011) -Patient admits to secondhand smoke and states she has a prior history of COPD and has been on MDIs in the past but stopped them.  -Will place on Symbicort and Spiriva, continue Mucinex.  -Will  continue antibiotics and antihistamines for acute bronchitis/URI      LOS: 1 day   Margrette Wynia C 01/02/2012, 6:44 PM

## 2012-01-02 NOTE — Evaluation (Signed)
Occupational Therapy Evaluation Patient Details Name: [name] MRN: [id number] DOB: 1916-02-27 Today's Date: 01/02/2012  Problem List:  Patient Active Problem List  Diagnoses  . Hyponatremia  . Hypertension  . Hypercholesterolemia  . Status post splenectomy  . Benign hypertensive heart disease without heart failure  . Ischemic heart disease  . Urinary tract infection  . Dyspnea  . Acute URI    Past Medical History:  Past Medical History  Diagnosis Date  . HTN (hypertension)   . Hypercholesteremia   . Ischemic heart disease     MI-2008  . Myocardial infarction 2008  . Breast lump     LEFT  . Abdominal fluid collection     likely seroma, evaluated by Interventional Radiology  . Hyponatremia   . Foot pain, left   . Otitis media      Left  . Laryngitis   . Coronary disease   . Dyspnea      Mild dyspnea with left lung infiltrate and leukocytosis and left  posterior otitis media  . Pleural effusion    Past Surgical History:  Past Surgical History  Procedure Date  . Splenectomy   . Hernia repair     OT Assessment/Plan/Recommendation OT Assessment Clinical Impression Statement: This 78 year old female was admitted with hyponatremia.  She is currently overall supervision with transfers and adls (except min A for LB) using 2 liters of 02.  At baseline, pt is independent with all adls, meal prep and driving.  She is appropriate for skilled OT to restore independence with ADLs OT Recommendation/Assessment: Patient will need skilled OT in the acute care venue OT Problem List: Decreased strength;Decreased activity tolerance;Cardiopulmonary status limiting activity;Decreased knowledge of use of DME or AE OT Therapy Diagnosis : Generalized weakness OT Plan OT Frequency: Min 2X/week OT Treatment/Interventions: Self-care/ADL training;Patient/family education;Energy conservation;DME and/or AE instruction OT Recommendation Follow Up Recommendations: Home health OT;Other  (comment) (depending upon progress).  Pt has family up from [facility] currently, but she doesn't have family in the area.  Her 2 children who were local are deceased.   Equipment Recommended: None recommended by OT Individuals Consulted Consulted and Agree with Results and Recommendations: Patient OT Goals Acute Rehab OT Goals OT Goal Formulation: With patient Time For Goal Achievement: 2 weeks ADL Goals Pt Will Perform Lower Body Bathing: with modified independence;Sit to stand from chair;Other (comment) (including clothing retrieval) ADL Goal: Lower Body Bathing - Progress: Goal set today Pt Will Perform Lower Body Dressing: with modified independence;Sit to stand from chair (including clothing retrieval) ADL Goal: Lower Body Dressing - Progress: Goal set today Pt Will Transfer to Toilet: with modified independence;Comfort height toilet;Ambulation ADL Goal: Toilet Transfer - Progress: Goal set today Pt Will Perform Tub/Shower Transfer: Shower transfer;with modified independence;Grab bars;Shower seat with back;Ambulation ADL Goal: Tub/Shower Transfer - Progress: Goal set today Miscellaneous OT Goals Miscellaneous OT Goal #1: Pt will state 2 energy conservation techniques OT Goal: Miscellaneous Goal #1 - Progress: Goal set today Miscellaneous OT Goal #2: Pt will demonstrate pursed lip breathing without cues when she experiences dyspnea OT Goal: Miscellaneous Goal #2 - Progress: Goal set today  OT Evaluation Precautions/Restrictions  Restrictions Weight Bearing Restrictions: No Prior Functioning Home Living Lives With: Alone Type of Home: House Home Layout: One level Home Access: Stairs to enter Entrance Stairs-Rails: Right Entrance Stairs-Number of Steps: 5 Bathroom Shower/Tub: Health visitor: Handicapped height Home Adaptive Equipment: Shower chair with back Prior Function Level of Independence: Independent with basic ADLs;Independent with  homemaking with  ambulation;Other (comment) (hires someone to clean; makes own meals) Driving: Yes Leisure:  (exercises several times a week) ADL ADL Grooming: Simulated;Supervision/safety Where Assessed - Grooming: Standing at sink Upper Body Bathing: Simulated;Set up Where Assessed - Upper Body Bathing: Sit to stand from chair Lower Body Bathing: Simulated;Supervision/safety Where Assessed - Lower Body Bathing: Sit to stand from chair Upper Body Dressing: Simulated;Minimal assistance;Other (comment) (iv) Where Assessed - Upper Body Dressing: Sit to stand from chair Lower Body Dressing: Simulated;Performed;Minimal assistance;Other (comment) (socks are hard; tried sock aid:  not easy for her) Where Assessed - Lower Body Dressing: Sit to stand from chair Toilet Transfer: Performed;Supervision/safety Toilet Transfer Method: Ambulating (no device; initially walked very fast due to urgency) Toilet Transfer Equipment: Bedside commode Toileting - Clothing Manipulation: Simulated;Supervision/safety Where Assessed - Toileting Clothing Manipulation: Standing Toileting - Hygiene: Performed;Set up Where Assessed - Toileting Hygiene: Sit on 3-in-1 or toilet Ambulation Related to ADLs: began energy conservation education ADL Comments: pt very independent:  socks are difficult but she manages.  Dyspnea 2/4 with activity:  sats 99% Vision/Perception  Vision - History Patient Visual Report: No change from baseline Cognition Cognition Arousal/Alertness: Awake/alert Overall Cognitive Status: Appears within functional limits for tasks assessed Sensation/Coordination Coordination Gross Motor Movements are Fluid and Coordinated: Yes Extremity Assessment RUE Assessment RUE Assessment: Within Functional Limits LUE Assessment LUE Assessment: Within Functional Limits Mobility  Transfers Transfers: Yes Sit to Stand: 5: Supervision Exercises   End of Session OT - End of Session Activity Tolerance: Patient  tolerated treatment well Patient left: in chair;with call bell in reach;with family/visitor present General Behavior During Session: [facility] for tasks performed Cognition: [facility] for tasks performed [facility], OTR/L [phone] 01/02/2012  [name] 01/02/2012, 4:05 PM

## 2012-01-02 NOTE — Evaluation (Signed)
Physical Therapy Evaluation Patient Details Name: ALYSSA ROTONDO MRN: [id number] DOB: December 17, 1915 Today's Date: 01/02/2012  Problem List:  Patient Active Problem List  Diagnoses  . Hyponatremia  . Hypertension  . Hypercholesterolemia  . Status post splenectomy  . Benign hypertensive heart disease without heart failure  . Ischemic heart disease  . Urinary tract infection  . Dyspnea  . Acute URI    Past Medical History:  Past Medical History  Diagnosis Date  . HTN (hypertension)   . Hypercholesteremia   . Ischemic heart disease     MI-2008  . Myocardial infarction 2008  . Breast lump     LEFT  . Abdominal fluid collection     likely seroma, evaluated by Interventional Radiology  . Hyponatremia   . Foot pain, left   . Otitis media      Left  . Laryngitis   . Coronary disease   . Dyspnea      Mild dyspnea with left lung infiltrate and leukocytosis and left  posterior otitis media  . Pleural effusion    Past Surgical History:  Past Surgical History  Procedure Date  . Splenectomy   . Hernia repair     PT Assessment/Plan/Recommendation PT Assessment Clinical Impression Statement: Pt admitted for hyponatremia and bronchitis.  Pt would benefit from acute PT services in order to improve activity tolerance and increase independence with ambulation and stairs prior to d/c home alone.  Pt reports she is not usually on oxygen and ambulated on oxygen with reports of SOB. PT Recommendation/Assessment: Patient will need skilled PT in the acute care venue PT Problem List: Decreased activity tolerance;Decreased mobility;Cardiopulmonary status limiting activity PT Therapy Diagnosis : Difficulty walking PT Plan PT Frequency: Min 3X/week PT Treatment/Interventions: DME instruction;Gait training;Stair training;Functional mobility training;Therapeutic activities;Therapeutic exercise;Patient/family education PT Recommendation Follow Up Recommendations: Home health PT;Other (comment)  (depending on progress) Equipment Recommended: None recommended by PT PT Goals  Acute Rehab PT Goals PT Goal Formulation: With patient Time For Goal Achievement: 7 days Pt will go Sit to Stand: with modified independence PT Goal: Sit to Stand - Progress: Goal set today Pt will go Stand to Sit: with modified independence PT Goal: Stand to Sit - Progress: Goal set today Pt will Ambulate: >150 feet;with modified independence;Other (comment) (no SOB) PT Goal: Ambulate - Progress: Goal set today Pt will Go Up / Down Stairs: 3-5 stairs;with rail(s);with modified independence PT Goal: Up/Down Stairs - Progress: Goal set today Pt will Perform Home Exercise Program: with supervision, verbal cues required/provided PT Goal: Perform Home Exercise Program - Progress: Goal set today  PT Evaluation Precautions/Restrictions  Precautions Precautions: Fall Restrictions Weight Bearing Restrictions: No Prior Functioning  Home Living Lives With: Alone Type of Home: House Home Layout: One level Home Access: Stairs to enter Entrance Stairs-Rails: Right Entrance Stairs-Number of Steps: 5 Bathroom Shower/Tub: Health visitor: Handicapped height Home Adaptive Equipment: Shower chair with back Prior Function Level of Independence: Independent with basic ADLs;Independent with homemaking with ambulation;Other (comment);Independent with gait (hires someone to clean; makes own meals) Driving: Yes Leisure:  (exercises several times a week) Cognition Cognition Arousal/Alertness: Awake/alert Overall Cognitive Status: Appears within functional limits for tasks assessed Sensation/Coordination Coordination Gross Motor Movements are Fluid and Coordinated: Yes Extremity Assessment RUE Assessment RUE Assessment: Within Functional Limits LUE Assessment LUE Assessment: Within Functional Limits RLE Assessment RLE Assessment: Within Functional Limits LLE Assessment LLE Assessment: Within  Functional Limits Mobility (including Balance) Bed Mobility Bed Mobility: No Transfers Transfers: Yes Sit  to Stand: 5: Supervision Sit to Stand Details (indicate cue type and reason): for lines, oxygen Stand to Sit: 5: Supervision Ambulation/Gait Ambulation/Gait: Yes Ambulation/Gait Assistance: 5: Supervision Ambulation/Gait Assistance Details (indicate cue type and reason): pt with occasional reach for rail, reports SOB, educated on pursed lip breathing (pt reports mouth-breather), one standing rest break needed Ambulation Distance (Feet): 120 Feet (x2) Assistive device: None Gait Pattern: Step-through pattern;Decreased stride length;Decreased hip/knee flexion - left;Decreased hip/knee flexion - right    Exercise    End of Session PT - End of Session Activity Tolerance: Patient tolerated treatment well Patient left: in chair;with family/visitor present;with call bell in reach General Behavior During Session: [facility] for tasks performed Cognition: [facility] for tasks performed  Julanne Schlueter,KATHrine E 01/02/2012, 4:15 PM Pager: [phone]

## 2012-01-03 MED ORDER — BENZONATATE 100 MG PO CAPS
100.0000 mg | ORAL_CAPSULE | Freq: Two times a day (BID) | ORAL | Status: DC | PRN
  Administered 2012-01-03 (×2): 100 mg via ORAL
  Filled 2012-01-03 (×2): qty 1

## 2012-01-03 NOTE — Progress Notes (Signed)
Pt was 95% on room air.  O2 remains off. [name]

## 2012-01-03 NOTE — Progress Notes (Addendum)
Subjective: Feels generally weak and tired.  Objective: Vital signs in last 24 hours: Temp:  [97.4 F (36.3 C)-98.4 F (36.9 C)] 98.2 F (36.8 C) (03/27 0531) Pulse Rate:  [64-65] 64  (03/27 0531) Resp:  [18-24] 24  (03/27 0531) BP: (136-154)/(67-83) 147/67 mmHg (03/27 0531) SpO2:  [95 %-99 %] 95 % (03/27 1108) Weight:  [64.592 kg (142 lb 6.4 oz)] 64.592 kg (142 lb 6.4 oz) (03/27 0531) Weight change: 2.092 kg (4 lb 9.8 oz) Last BM Date: 01/02/12  Intake/Output from previous day: 03/26 0701 - 03/27 0700 In: 1500 [I.V.:1500] Out: 925 [Urine:925] Total I/O In: 163 [P.O.:160; I.V.:3] Out: 500 [Urine:500]   Physical Exam: General: Alert, awake, oriented x3, in no acute distress. HEENT: No bruits, no goiter. Heart: Regular rate and rhythm, without murmurs, rubs, gallops. Lungs: Mild bilateral ronchi, no wheezes Abdomen: Soft, nontender, nondistended, positive bowel sounds. Extremities: No clubbing cyanosis or edema with positive pedal pulses. Neuro: Grossly intact, nonfocal.    Lab Results: Basic Metabolic Panel:  Basename 01/02/12 0750 01/01/12 1852  NA 128* 126*  K 4.2 3.8  CL 96 94*  CO2 25 25  GLUCOSE 96 126*  BUN 12 11  CREATININE 0.76 0.76  CALCIUM 8.2* 8.2*  MG -- --  PHOS -- --   CBC:  Basename 01/01/12 1112  WBC 8.2  NEUTROABS 3.4  HGB 14.0  HCT 40.8  MCV 87.2  PLT 233   Cardiac Enzymes:  Basename 01/02/12 0750 01/02/12 0007 01/01/12 1640  CKTOTAL 39 39 36  CKMB 1.7 1.6 1.5  CKMBINDEX -- -- --  TROPONINI <0.30 <0.30 <0.30   BNP:  Basename 01/01/12 1640  PROBNP 1675.0*   Thyroid Function Tests:  Basename 01/01/12 1639  TSH 2.775  T4TOTAL --  FREET4 --  T3FREE --  THYROIDAB --   Urinalysis:  Basename 01/01/12 1524  COLORURINE YELLOW  LABSPEC 1.009  PHURINE 6.5  GLUCOSEU NEGATIVE  HGBUR NEGATIVE  BILIRUBINUR NEGATIVE  KETONESUR NEGATIVE  PROTEINUR NEGATIVE  UROBILINOGEN 0.2  NITRITE NEGATIVE  LEUKOCYTESUR NEGATIVE     Recent Results (from the past 240 hour(s))  URINE CULTURE     Status: Normal (Preliminary result)   Collection Time   01/01/12  3:24 PM      Component Value Range Status Comment   Specimen Description URINE, RANDOM   Final    Special Requests NONE   Final    Culture  Setup Time [id number]   Final    Colony Count 50,000 COLONIES/ML   Final    Culture GRAM NEGATIVE RODS   Final    Report Status PENDING   Incomplete     Studies/Results: No results found.  Medications: Scheduled Meds:   . sodium chloride   Intravenous STAT  . aspirin EC  81 mg Oral Daily  . azithromycin  250 mg Oral Daily  . budesonide-formoterol  2 puff Inhalation BID  . enoxaparin  40 mg Subcutaneous Q24H  . fluticasone  2 spray Each Nare Daily  . guaiFENesin  600 mg Oral BID  . loratadine  10 mg Oral Daily  . losartan  100 mg Oral Daily  . metoprolol succinate  25 mg Oral Daily  . mulitivitamin with minerals  1 tablet Oral Daily  . sodium chloride  3 mL Intravenous Q12H  . tiotropium  18 mcg Inhalation Daily   Continuous Infusions:   . sodium chloride 75 mL/hr at 01/03/12 0334   PRN Meds:.acetaminophen, acetaminophen, benzonatate, guaiFENesin-dextromethorphan, HYDROcodone-acetaminophen, levalbuterol, nitroGLYCERIN  Assessment/Plan:  Active Problems:  Hyponatremia  Hypertension  Benign hypertensive heart disease without heart failure  Dyspnea  Acute URI   #1 Hyponatremia: Slowly improving with IVF. Will discontinue lasix for now.  #2 Acute URI/Bronchitis: Continue mucinex. No need for antibiotic therapy at this point.  #3 COPD: Agree with newly started symbicort and spiriva since will help decrease exacerbations requiring hospitalizations.  #4 HTN: Well controlled. Follow, Off lasix.  #5 Deconditioning: Refuses setup of HHPT. Says she had it in the past and did not find it useful.  #6 Dispo: hopeful for DC in am as long as Na increases and no longer requiring oxygen.   LOS: 2 days    [facility] Pager: [phone] 01/03/2012, 12:30 PM

## 2012-01-03 NOTE — Progress Notes (Signed)
   CARE MANAGEMENT NOTE 01/03/2012  Patient:  ARLENE, BRICKEL   Account Number:  [id number]  Date Initiated:  01/02/2012  Documentation initiated by:  PEARSON,COOKIE  Subjective/Objective Assessment:   pt admitted with cco SOB     Action/Plan:   from home, lives alone with family support   Anticipated DC Date:  01/05/2012   Anticipated DC Plan:  HOME W HOME HEALTH SERVICES      DC Planning Services  Patient refused services      Choice offered to / List presented to:             Status of service:  In process, will continue to follow Medicare Important Message given?  NO (If response is "NO", the following Medicare IM given date fields will be blank) Date Medicare IM given:   Date Additional Medicare IM given:    Discharge Disposition:    Per UR Regulation:  Reviewed for med. necessity/level of care/duration of stay  If discussed at Long Length of Stay Meetings, dates discussed:    Comments:  01/03/12 Emonee Winkowski RN,BSN NCM [phone] PT-HH.PATIENT PLEASANTLY DECLINES HHPT,SAYS THE LAST TIME SHE WAS IN THE HOSPITAL & WAS RECOMMENDED FOR HHPT,THE THERAPIST THAT CAME OUT SAID "WHY DID THEY RECOMMEND HHPT,YOU DON'T NEED IT"  01/02/12 MPearson, RN, BSN Chart reviewed.

## 2012-01-04 LAB — URINE CULTURE: Colony Count: 50000

## 2012-01-04 LAB — BASIC METABOLIC PANEL
CO2: 24 mEq/L (ref 19–32)
Calcium: 8.2 mg/dL — ABNORMAL LOW (ref 8.4–10.5)
Glucose, Bld: 106 mg/dL — ABNORMAL HIGH (ref 70–99)
Sodium: 128 mEq/L — ABNORMAL LOW (ref 135–145)

## 2012-01-04 LAB — CBC
Hemoglobin: 13 g/dL (ref 12.0–15.0)
MCH: 29.7 pg (ref 26.0–34.0)
MCV: 88.3 fL (ref 78.0–100.0)
Platelets: 244 10*3/uL (ref 150–400)
RBC: 4.37 MIL/uL (ref 3.87–5.11)

## 2012-01-04 MED ORDER — ALBUTEROL SULFATE HFA 108 (90 BASE) MCG/ACT IN AERS: 2.0000 | INHALATION_SPRAY | Freq: Four times a day (QID) | RESPIRATORY_TRACT | Status: DC | PRN

## 2012-01-04 MED ORDER — LEVOFLOXACIN 750 MG PO TABS
750.0000 mg | ORAL_TABLET | Freq: Every day | ORAL | Status: DC
  Administered 2012-01-04: 750 mg via ORAL
  Filled 2012-01-04: qty 1

## 2012-01-04 MED ORDER — TIOTROPIUM BROMIDE MONOHYDRATE 18 MCG IN CAPS: 18.0000 ug | ORAL_CAPSULE | Freq: Every day | RESPIRATORY_TRACT | Status: DC

## 2012-01-04 MED ORDER — HYDROCODONE-ACETAMINOPHEN 5-325 MG PO TABS: 1.0000 | ORAL_TABLET | Freq: Three times a day (TID) | ORAL | Status: DC | PRN

## 2012-01-04 MED ORDER — HYDROCOD POLST-CHLORPHEN POLST 10-8 MG/5ML PO LQCR: 5.0000 mL | Freq: Two times a day (BID) | ORAL | Status: AC | PRN

## 2012-01-04 MED ORDER — LORATADINE 10 MG PO TABS: 10.0000 mg | ORAL_TABLET | Freq: Every day | ORAL | Status: DC

## 2012-01-04 MED ORDER — BENZONATATE 100 MG PO CAPS: 100.0000 mg | ORAL_CAPSULE | Freq: Two times a day (BID) | ORAL | Status: DC | PRN

## 2012-01-04 MED ORDER — BUDESONIDE-FORMOTEROL FUMARATE 160-4.5 MCG/ACT IN AERO: 2.0000 | INHALATION_SPRAY | Freq: Two times a day (BID) | RESPIRATORY_TRACT | Status: DC

## 2012-01-04 MED ORDER — LEVOFLOXACIN 750 MG PO TABS: 750.0000 mg | ORAL_TABLET | ORAL | Status: DC

## 2012-01-04 NOTE — Progress Notes (Signed)
Occupational Therapy Treatment Patient Details Name: [name] MRN: [id number] DOB: May 25, 1916 Today's Date: 01/04/2012 11:02-11:29  2sc OT Assessment/Plan OT Assessment/Plan Comments on Treatment Session: Mrs. Schwarzkopf has made excellent progress with OT to a modified independent level for simulated selfcare tasks and related transfers.  Has all OT related DME and has been educated on energy conservation strategies and safety for slefcare tasks.  Still with some limited endurance but feel this will improve with performance of ADLs.  No further acute or post acute OT needs.    Follow Up Recommendations: No OT follow up Equipment Recommended: None recommended by OT OT Goals ADL Goals ADL Goal: Lower Body Bathing - Progress: Met ADL Goal: Lower Body Dressing - Progress: Met ADL Goal: Toilet Transfer - Progress: Met ADL Goal: Tub/Shower Transfer - Progress: Met Miscellaneous OT Goals OT Goal: Miscellaneous Goal #1 - Progress: Met OT Goal: Miscellaneous Goal #2 - Progress: Met  OT Treatment Precautions/Restrictions  Precautions Required Braces or Orthoses: No Restrictions Weight Bearing Restrictions: No   ADL ADL Toilet Transfer: Simulated;Modified independent Toilet Transfer Method: Proofreader: Comfort height toilet Toileting - Clothing Manipulation: Simulated;Modified independent Where Assessed - Toileting Clothing Manipulation: Sit to stand from 3-in-1 or toilet Toileting - Hygiene: Simulated;Modified independent Where Assessed - Toileting Hygiene: Sit to stand from 3-in-1 or toilet Tub/Shower Transfer: Performed;Modified independent Tub/Shower Transfer Method: Science writer: Walk in shower;Grab bars Ambulation Related to ADLs: Pt overall modified independent for ambulation around the room without use of any assistive device. ADL Comments: Pt educated on energy conservation for bathing and dressing.  Already has a shower  seat but wasn't using it prior to admission. Worked on dynamic standing balance tasks for simulated selfcare tasks.  Pt able to retrieve multiple itmes from the floor without difficulty.  Does need use of a wall or grab bar for shower transfer secondary to not being able to smoothly transition over the shower ledge.  Discussed use of night lights for toleting at overnight at home.  Pt currently modified independent with simulated selfcare tasks.  No further OT needs.  Mobility  Bed Mobility Bed Mobility: No Transfers Transfers: Yes Sit to Stand: 6: Modified independent (Device/Increase time);With upper extremity assist;From chair/3-in-1  End of Session OT - End of Session Equipment Utilized During Treatment: Gait belt Activity Tolerance: Patient tolerated treatment well (Noted sight dyspnea 2/4 with activity.) Patient left: in chair;with call bell in reach;with family/visitor present General Behavior During Session: [facility] for tasks performed Cognition: [facility] for tasks performed  Akili Cuda OTR/L 01/04/2012, 1:16 PM Pager number [phone]

## 2012-01-04 NOTE — Discharge Planning (Signed)
Physician Discharge Summary  Patient ID: [name] MRN: [id number] DOB/AGE: 76-08-1916 76 y.o.  Admit date: 01/01/2012 Discharge date: 01/04/2012  Primary Care Physician:  Cassell Clement, MD, MD   Discharge Diagnoses:    Active Problems:  Dyspnea 2/2 viral URI on top of  Baseline COPD  Hyponatremia  Hypertension  Benign hypertensive heart disease without heart failure  Dyspnea  Acute URI  COPD  Medication List  As of 01/04/2012  2:47 PM   STOP taking these medications         azithromycin 250 MG tablet      furosemide 40 MG tablet         TAKE these medications         albuterol 108 (90 BASE) MCG/ACT inhaler   Commonly known as: PROVENTIL HFA;VENTOLIN HFA   Inhale 2 puffs into the lungs every 6 (six) hours as needed for wheezing.      aspirin 81 MG tablet   Take 81 mg by mouth daily.      benzonatate 100 MG capsule   Commonly known as: TESSALON   Take 1 capsule (100 mg total) by mouth 2 (two) times daily as needed for cough.      budesonide-formoterol 160-4.5 MCG/ACT inhaler   Commonly known as: SYMBICORT   Inhale 2 puffs into the lungs 2 (two) times daily.      CALCIUM 600 + D PO   Take 1 tablet by mouth daily.      chlorpheniramine-HYDROcodone 10-8 MG/5ML Lqcr   Commonly known as: TUSSIONEX   Take 5 mLs by mouth every 12 (twelve) hours as needed (cough).      fluticasone 50 MCG/ACT nasal spray   Commonly known as: FLONASE   Place 2 sprays into the nose daily.      HYDROcodone-acetaminophen 5-325 MG per tablet   Commonly known as: NORCO   Take 1 tablet by mouth every 8 (eight) hours as needed.      loratadine 10 MG tablet   Commonly known as: CLARITIN   Take 1 tablet (10 mg total) by mouth daily.      losartan 100 MG tablet   Commonly known as: COZAAR   TAKE 1 TABLET EVERY DAY      metoprolol succinate 25 MG 24 hr tablet   Commonly known as: TOPROL-XL   Take 1 tablet (25 mg total) by mouth daily.      multivitamin tablet   Take 1 tablet  by mouth daily.      nitroGLYCERIN 0.4 MG SL tablet   Commonly known as: NITROSTAT   Place 0.4 mg under the tongue every 5 (five) minutes as needed.      tiotropium 18 MCG inhalation capsule   Commonly known as: SPIRIVA   Place 1 capsule (18 mcg total) into inhaler and inhale daily.             Disposition and Follow-up: Pt medically stable and ready for discharge to home. Will follow up with Dr. Patty Foley her PCP in 1 week. May benefit from OP pulmonology consult.   Consults:  None    Significant Diagnostic Studies:  Dg Chest Port 1 View  01/01/2012  *RADIOLOGY REPORT*  Clinical Data: Cough.  PORTABLE CHEST - 1 VIEW  Comparison: 11/28/2011.  Findings: The heart is mildly enlarged but stable.  Stable tortuosity and calcification of the thoracic aorta.  Chronic lung changes but no acute overlying pulmonary process.  IMPRESSION: Mild stable cardiac enlargement and chronic bronchitic type  lung changes but no acute pulmonary findings.  Original Report Authenticated By: P. Loralie Champagne, M.D.    Labs Reviewed  DIFFERENTIAL - Abnormal; Notable for the following:    Neutrophils Relative 41 (*)    Monocytes Relative 13 (*)    All other components within normal limits  BASIC METABOLIC PANEL - Abnormal; Notable for the following:    Sodium 125 (*)    Chloride 92 (*)    Glucose, Bld 102 (*)    GFR calc non Af Amer 71 (*)    GFR calc Af Amer 83 (*)    All other components within normal limits  PRO B NATRIURETIC PEPTIDE - Abnormal; Notable for the following:    Pro B Natriuretic peptide (BNP) 1675.0 (*)    All other components within normal limits  BASIC METABOLIC PANEL - Abnormal; Notable for the following:    Sodium 126 (*)    Chloride 94 (*)    Glucose, Bld 126 (*)    Calcium 8.2 (*)    GFR calc non Af Amer 70 (*)    GFR calc Af Amer 81 (*)    All other components within normal limits  BASIC METABOLIC PANEL - Abnormal; Notable for the following:    Sodium 128 (*)    Calcium  8.2 (*)    GFR calc non Af Amer 70 (*)    GFR calc Af Amer 81 (*)    All other components within normal limits  OSMOLALITY - Abnormal; Notable for the following:    Osmolality 265 (*)    All other components within normal limits  BASIC METABOLIC PANEL - Abnormal; Notable for the following:    Sodium 128 (*)    Glucose, Bld 106 (*)    Calcium 8.2 (*)    GFR calc non Af Amer 73 (*)    GFR calc Af Amer 85 (*)    All other components within normal limits  CBC - Abnormal; Notable for the following:    WBC 12.5 (*)    All other components within normal limits  CBC  URINE CULTURE  URINALYSIS, ROUTINE W REFLEX MICROSCOPIC  TSH  CARDIAC PANEL(CRET KIN+CKTOT+MB+TROPI)  CARDIAC PANEL(CRET KIN+CKTOT+MB+TROPI)  CARDIAC PANEL(CRET KIN+CKTOT+MB+TROPI)  SODIUM, URINE, RANDOM  CREATININE, URINE, RANDOM  OSMOLALITY, URINE  CBC  CREATININE, SERUM        Dg Chest Port 1 View  01/01/2012  *RADIOLOGY REPORT*  Clinical Data: Cough.  PORTABLE CHEST - 1 VIEW  Comparison: 11/28/2011.  Findings: The heart is mildly enlarged but stable.  Stable tortuosity and calcification of the thoracic aorta.  Chronic lung changes but no acute overlying pulmonary process.  IMPRESSION: Mild stable cardiac enlargement and chronic bronchitic type lung changes but no acute pulmonary findings.  Original Report Authenticated By: P. Loralie Champagne, M.D.       Brief H and P: For complete details please refer to admission H and P, but in brief The patient is a 78 year old  female who lives at home with history of hyponatremia in the past (but had last sodium November of 2012 136), hypertension, ischemic heart disease/MI in 2008 who presents to [facility] ED 01/01/12 with CC of dyspnea. She stated that she has had a URI/cold symptoms for the past 5 days, and her primary care physician Dr. Patty Foley was treating it with Zithromax and Mucinex but she has progressively becoming more  dyspneic. She reports that initially was mostly the  cough but she began having some nasal congestion  and the dyspnea. She denies leg swelling, also denies PND. She stated that today and she began having a vague chest tightness-in the upper chest area close to her neck. She admited to nausea but no vomiting. She was seen in the ED and a chest x-ray was done and showed findings consistent with COPD, mild atelectasis, scarring, pleural effusion improved from the prior study. Labs done in the ED revealed a sodium of 125. She is admitted for further evaluation and management. She denies diarrhea, dysuria, and no focal weakness.     Hospital Course:   Active Problems:  Hyponatremia  Hypertension  Benign hypertensive heart disease without heart failure  Dyspnea  Acute URI  COPD   #1 Hyponatremia: sodium 126 on admission. Provided with gentle IV hydration and lasix held. At time of discharge sodium stable at 128. Suspect chronic component.  Volume +1280 for hospitalization. Will need bmet in 1 week to evaluate. Will discontinue home lasix at discharge.    #2 Dyspnea : secondary to viral upper respiratory infection in setting of COPD. Provided with 02 support as well as bronchodilators. Pt remained afebrile and non-toxic. At time of discharge she  continues with cough especially at night. Sats >90 on ra at rest and with ambulation. Will provide tussionex at discharge.   #3 COPD: Pt started on symbicort and spiriva and responded positively to these treatments. Hopefully this  will help decrease exacerbations requiring hospitalizations. At time of discharge pt ambulated room air and sats range 89-94. Would recommend OP follow up with pulmonologist.    #4. UTI. Culture klebsiella pneumoniae.U/A WNL.??lab error Pt asymptomatic. Will not treat with antibiotics.     #5 HTN: Fair control during hospitalization while off lasix. At time of discharge SBP range 170-186.     #6 Deconditioning: Refuses setup of HHPT. Says she had it in the past and did not find  it useful. Ambulated in hall independently.    Time spent on Discharge: Greater than 30 minutes  Signed: Gwenyth Foley 01/04/2012, 2:00 PM

## 2012-01-04 NOTE — Progress Notes (Signed)
Subjective: Up in chair. Complains of cough but unable to "get that stuff up". Reports SOB.   Objective: Vital signs Filed Vitals:   01/03/12 2134 01/04/12 0447 01/04/12 0800 01/04/12 0917  BP: 170/72 176/76    Pulse: 72 74 70   Temp: 98.2 F (36.8 C) 98.4 F (36.9 C) 98 F (36.7 C)   TempSrc: Oral Oral Oral   Resp: 16 18 16    Height:      Weight:  66.996 kg (147 lb 11.2 oz)    SpO2: 96% 91% 95% 97%   Weight change: 2.404 kg (5 lb 4.8 oz) Last BM Date: 01/04/12  Intake/Output from previous day: 03/27 0701 - 03/28 0700 In: 2866.8 [P.O.:1000; I.V.:1866.8] Out: 2625 [Urine:2625] Total I/O In: 240 [P.O.:240] Out: -    Physical Exam: General: Alert, awake, oriented x3, in no acute distress. Up in chair.  HEENT: No bruits, no goiter. Mucus membranes moist/pink. PERRL Heart: Regular rate and rhythm, without murmurs, rubs, gallops. Lungs:Very slight increased work of breathing with conversation. Breath sounds slightly distant but otherwise  clear to auscultation bilaterally. No wheeze, rhonchi Abdomen: Soft, nontender, nondistended, positive bowel sounds. Extremities: No clubbing cyanosis or edema with positive pedal pulses. Neuro: Grossly intact, nonfocal. Speech clear.     Lab Results: Basic Metabolic Panel:  Basename 01/04/12 0515 01/02/12 0750  NA 128* 128*  K 4.1 4.2  CL 97 96  CO2 24 25  GLUCOSE 106* 96  BUN 12 12  CREATININE 0.66 0.76  CALCIUM 8.2* 8.2*  MG -- --  PHOS -- --   Liver Function Tests: No results found for this basename: AST:2,ALT:2,ALKPHOS:2,BILITOT:2,PROT:2,ALBUMIN:2 in the last 72 hours No results found for this basename: LIPASE:2,AMYLASE:2 in the last 72 hours No results found for this basename: AMMONIA:2 in the last 72 hours CBC:  Basename 01/04/12 0515 01/01/12 1112  WBC 12.5* 8.2  NEUTROABS -- 3.4  HGB 13.0 14.0  HCT 38.6 40.8  MCV 88.3 87.2  PLT 244 233   Cardiac Enzymes:  Basename 01/02/12 0750 01/02/12 0007 01/01/12 1640    CKTOTAL 39 39 36  CKMB 1.7 1.6 1.5  CKMBINDEX -- -- --  TROPONINI <0.30 <0.30 <0.30   BNP:  Basename 01/01/12 1640  PROBNP 1675.0*   D-Dimer: No results found for this basename: DDIMER:2 in the last 72 hours CBG: No results found for this basename: GLUCAP:6 in the last 72 hours Hemoglobin A1C: No results found for this basename: HGBA1C in the last 72 hours Fasting Lipid Panel: No results found for this basename: CHOL,HDL,LDLCALC,TRIG,CHOLHDL,LDLDIRECT in the last 72 hours Thyroid Function Tests:  Basename 01/01/12 1639  TSH 2.775  T4TOTAL --  FREET4 --  T3FREE --  THYROIDAB --   Anemia Panel: No results found for this basename: VITAMINB12,FOLATE,FERRITIN,TIBC,IRON,RETICCTPCT in the last 72 hours Coagulation: No results found for this basename: LABPROT:2,INR:2 in the last 72 hours Urine Drug Screen: Drugs of Abuse  No results found for this basename: labopia, cocainscrnur, labbenz, amphetmu, thcu, labbarb    Alcohol Level: No results found for this basename: ETH:2 in the last 72 hours Urinalysis:  Basename 01/01/12 1524  COLORURINE YELLOW  LABSPEC 1.009  PHURINE 6.5  GLUCOSEU NEGATIVE  HGBUR NEGATIVE  BILIRUBINUR NEGATIVE  KETONESUR NEGATIVE  PROTEINUR NEGATIVE  UROBILINOGEN 0.2  NITRITE NEGATIVE  LEUKOCYTESUR NEGATIVE   Misc. Labs:  Recent Results (from the past 240 hour(s))  URINE CULTURE     Status: Normal   Collection Time   01/01/12  3:24 PM  Component Value Range Status Comment   Specimen Description URINE, RANDOM   Final    Special Requests NONE   Final    Culture  Setup Time [id number]   Final    Colony Count 50,000 COLONIES/ML   Final    Culture KLEBSIELLA PNEUMONIAE   Final    Report Status 01/04/2012 FINAL   Final    Organism ID, Bacteria KLEBSIELLA PNEUMONIAE   Final     Studies/Results: No results found.  Medications: Scheduled Meds:   . aspirin EC  81 mg Oral Daily  . budesonide-formoterol  2 puff Inhalation BID  .  enoxaparin  40 mg Subcutaneous Q24H  . fluticasone  2 spray Each Nare Daily  . guaiFENesin  600 mg Oral BID  . levofloxacin  750 mg Oral Daily  . loratadine  10 mg Oral Daily  . losartan  100 mg Oral Daily  . metoprolol succinate  25 mg Oral Daily  . mulitivitamin with minerals  1 tablet Oral Daily  . sodium chloride  3 mL Intravenous Q12H  . tiotropium  18 mcg Inhalation Daily   Continuous Infusions:   . sodium chloride 75 mL/hr at 01/04/12 0730   PRN Meds:.acetaminophen, acetaminophen, benzonatate, guaiFENesin-dextromethorphan, HYDROcodone-acetaminophen, levalbuterol, nitroGLYCERIN  Assessment/Plan:  Active Problems:  Hyponatremia  Hypertension  Benign hypertensive heart disease without heart failure  Dyspnea  Acute URI 1 Hyponatremia: Stable at 128. Suspect chronic component. Will decrease IVF. Continue to hold lasix for now. Volume +1280 for hospitalization. Will monitor closely #2 Acute URI/Bronchitis: Continues with cough especially at night. WC up somewhat. Will start levaquin. Continue mucinex.   #3 COPD: Agree with newly started symbicort and spiriva since will help decrease exacerbations requiring hospitalizations. Will ambulate in hall and check o2 sats. Currently 91-95 on ra at rest.  #4. UTI. Culture klebsiella pneumoniae. Will start levaquin to cover #2 as well.  #5 HTN: Fairl control. Off lasix. Will monitor closely. Will resume lasix likely tomorrow.  #6 Deconditioning: Refuses setup of HHPT. Says she had it in the past and did not find it useful.       LOS: 3 days   [facility] M 01/04/2012, 10:22 AM

## 2012-01-04 NOTE — Progress Notes (Signed)
Pt. Ambulated in the hallway from one end to the other with minimal assistance on RA.  Her oxygen saturation ranged between 89-94%.  Patient did complain of feeling shortness of breath.  When asked when the shortness of breath started to occur, she stated "I'm short of breath a lot of the time."  Will continue to monitor.  [name], [name]

## 2012-01-04 NOTE — Progress Notes (Signed)
   CARE MANAGEMENT NOTE 01/04/2012  Patient:  [name], [name]   Account Number:  [id number]  Date Initiated:  01/02/2012  Documentation initiated by:  PEARSON,COOKIE  Subjective/Objective Assessment:   pt admitted with cco SOB     Action/Plan:   from home, lives alone with family support   Anticipated DC Date:  01/05/2012   Anticipated DC Plan:  HOME W HOME HEALTH SERVICES      DC Planning Services  Patient refused services      Choice offered to / List presented to:             Status of service:  Completed, signed off Medicare Important Message given?  NO (If response is "NO", the following Medicare IM given date fields will be blank) Date Medicare IM given:   Date Additional Medicare IM given:    Discharge Disposition:    Per UR Regulation:  Reviewed for med. necessity/level of care/duration of stay  If discussed at Long Length of Stay Meetings, dates discussed:    Comments:  01/03/12 Jodi Melgarejo RN,BSN NCM [phone] PT-HH.PATIENT PLEASANTLY DECLINES HHPT,SAYS THE LAST TIME SHE WAS IN THE HOSPITAL & WAS RECOMMENDED FOR HHPT,THE THERAPIST THAT CAME OUT SAID "WHY DID THEY RECOMMEND HHPT,YOU DON'T NEED IT"  01/02/12 MPearson, RN, BSN Chart reviewed.

## 2012-01-04 NOTE — Discharge Instructions (Addendum)
Bronchitis Bronchitis is a problem of the air tubes leading to your lungs. This problem makes it hard for air to get in and out of the lungs. You may cough a lot because your air tubes are narrow. Going without care can cause lasting (chronic) bronchitis. HOME CARE   Drink enough fluids to keep your pee (urine) clear or pale yellow.   Use a cool mist humidifier.   Quit smoking if you smoke. If you keep smoking, the bronchitis might not get better.   Only take medicine as told by your doctor.  GET HELP RIGHT AWAY IF:   Coughing keeps you awake.   You start to wheeze.   You become more sick or weak.   You have a hard time breathing or get short of breath.   You cough up blood.   Coughing lasts more than 2 weeks.   You have a fever.   Your baby is older than 3 months with a rectal temperature of 102 F (38.9 C) or higher.   Your baby is 25 months old or younger with a rectal temperature of 100.4 F (38 C) or higher.  MAKE SURE YOU:  Understand these instructions.   Will watch your condition.   Will get help right away if you are not doing well or get worse.  Document Released: 03/13/2008 Document Revised: 09/14/2011 Document Reviewed: 08/27/2009 [facility] Patient Information 2012 [place], [place].Shortness of Breath Shortness of breath (dyspnea) is the feeling of uneasy breathing. Shortness of breath needs care right away. HOME CARE   Do not smoke.   Avoid being around chemicals that may bother your breathing (such as paint fumes or dust).   Rest as needed. Slowly begin your usual activities.   Only take medicine as told by your doctor. Inhaled medicines or oxygen might be part of your treatment.   Follow up with your doctor as told. Waiting to do so or failure to follow up could result in worsening your condition and possible disability or death.   Understand what to do or who to call if your shortness of breath gets worse.  GET HELP RIGHT AWAY IF:   You get pain  in your chest, shoulders, belly (abdomen), or jaw.   You cannot stop coughing or you start wheezing.   You cough up blood or thick mucus.   You can only speak with short words.   You have a fever.   You feel your heart racing or skipping beats.   You are not breathing better when you stop and rest.   Your condition does not improve in the time expected.   You have problems with medicines.  MAKE SURE YOU:  Understand these instructions.   Will watch your condition.   Will get help right away if you are not doing well or get worse.  Document Released: 03/13/2008 Document Revised: 09/14/2011 Document Reviewed: 08/11/2009 [facility] Patient Information 2012 [place], [place].

## 2012-01-04 NOTE — Progress Notes (Signed)
PT Cancellation Note  Treatment cancelled today due to pt being discharged.  Pt reports she has no concerns about going home and denies need to practice stairs prior to d/c.  Jaquan Sadowsky,KATHrine E 01/04/2012, 2:57 PM Pager: [phone]

## 2012-01-07 NOTE — Progress Notes (Signed)
Agree with note by Toya Smothers, NP. Please see DC Summary for further details.  Peggye Pitt, MD [facility] Pager: [phone]

## 2012-01-10 ENCOUNTER — Encounter: Payer: Self-pay | Admitting: Nurse Practitioner

## 2012-01-10 ENCOUNTER — Ambulatory Visit (INDEPENDENT_AMBULATORY_CARE_PROVIDER_SITE_OTHER): Payer: Medicare Other | Admitting: Nurse Practitioner

## 2012-01-10 VITALS — BP 160/100 | HR 62 | Ht 60.0 in | Wt 148.0 lb

## 2012-01-10 DIAGNOSIS — E871 Hypo-osmolality and hyponatremia: Secondary | ICD-10-CM

## 2012-01-10 DIAGNOSIS — I10 Essential (primary) hypertension: Secondary | ICD-10-CM

## 2012-01-10 DIAGNOSIS — N390 Urinary tract infection, site not specified: Secondary | ICD-10-CM

## 2012-01-10 DIAGNOSIS — R0600 Dyspnea, unspecified: Secondary | ICD-10-CM

## 2012-01-10 DIAGNOSIS — R0609 Other forms of dyspnea: Secondary | ICD-10-CM

## 2012-01-10 LAB — URINALYSIS, ROUTINE W REFLEX MICROSCOPIC
Bilirubin Urine: NEGATIVE
Hgb urine dipstick: NEGATIVE
Ketones, ur: NEGATIVE
Leukocytes, UA: NEGATIVE
Nitrite: NEGATIVE
Specific Gravity, Urine: 1.01 (ref 1.000–1.030)
Total Protein, Urine: NEGATIVE
Urine Glucose: NEGATIVE
Urobilinogen, UA: 0.2 (ref 0.0–1.0)
WBC, UA: NONE SEEN (ref 0–?)
pH: 7 (ref 5.0–8.0)

## 2012-01-10 LAB — BASIC METABOLIC PANEL
BUN: 19 mg/dL (ref 6–23)
CO2: 26 mEq/L (ref 19–32)
Calcium: 9.8 mg/dL (ref 8.4–10.5)
Chloride: 97 mEq/L (ref 96–112)
Creatinine, Ser: 0.8 mg/dL (ref 0.4–1.2)
GFR: 70.99 mL/min (ref >60.00)
Glucose, Bld: 94 mg/dL (ref 70–99)
Potassium: 5.2 mEq/L — ABNORMAL HIGH (ref 3.5–5.1)
Sodium: 133 mEq/L — ABNORMAL LOW (ref 135–145)

## 2012-01-10 LAB — CBC WITH DIFFERENTIAL/PLATELET
Basophils Absolute: 0.1 10*3/uL (ref 0.0–0.1)
Basophils Relative: 0.5 % (ref 0.0–3.0)
Eosinophils Absolute: 0.3 10*3/uL (ref 0.0–0.7)
Eosinophils Relative: 1.8 % (ref 0.0–5.0)
HCT: 41.2 % (ref 36.0–46.0)
Hemoglobin: 13.6 g/dL (ref 12.0–15.0)
Lymphocytes Relative: 33.2 % (ref 12.0–46.0)
Lymphs Abs: 4.9 10*3/uL — ABNORMAL HIGH (ref 0.7–4.0)
MCHC: 33 g/dL (ref 30.0–36.0)
MCV: 91.1 fl (ref 78.0–100.0)
Monocytes Absolute: 1.8 10*3/uL — ABNORMAL HIGH (ref 0.1–1.0)
Monocytes Relative: 12.4 % — ABNORMAL HIGH (ref 3.0–12.0)
Neutro Abs: 7.7 10*3/uL (ref 1.4–7.7)
Neutrophils Relative %: 52.1 % (ref 43.0–77.0)
Platelets: 346 10*3/uL (ref 150.0–400.0)
RBC: 4.53 Mil/uL (ref 3.87–5.11)
RDW: 13.4 % (ref 11.5–14.6)
WBC: 14.8 10*3/uL — ABNORMAL HIGH (ref 4.5–10.5)

## 2012-01-10 LAB — BRAIN NATRIURETIC PEPTIDE: Pro B Natriuretic peptide (BNP): 176 pg/mL — ABNORMAL HIGH (ref 0.0–100.0)

## 2012-01-10 NOTE — Patient Instructions (Signed)
We are going to check some labs today and check a urine sample  Go back on your Lasix to a half a tablet every other day (just like you used to take)  Stay on your other medicines.  We will see you later this month  We will arrange for you to see the lung doctor  Call the [facility] Care office at [phone] if you have any questions, problems or concerns.

## 2012-01-10 NOTE — Progress Notes (Signed)
[name] Date of Birth: 12-19-1915 Medical Record #[id number]  History of Present Illness: [name] is seen today for a post hospital visit. She is seen for [name]. She is a very pleasant 78 year old female with recent admission for bronchitis/URI on top of underlying COPD. She was hospitalized after failing on outpatient therapy which consisted of Zithromax and Mucinex. She was noted to have a sodium of 125 on admission. She was quite weak and admitted to having nausea but no vomiting. She was treated with oxygen, bronchodilators and placed on inhalers. There was concern for a UTI and she will need a repeat UA. Blood pressure was reportedly "fair" but at time of discharge was in the 170 to 186 range.   She comes in today. She is here alone. She remains short of breath. Still with some wheezing. She is taking her inhalers. Slowly getting stronger she thinks. No fever, chills or coughing. No chest pain reported. She is not having any swelling. She is worried about being off of her Lasix. In questioning her, does not seem like she has been drinking too much water. She is not on HCTZ. She is on ARB therapy.   Current Outpatient Prescriptions on File Prior to Visit  Medication Sig Dispense Refill  . albuterol (PROVENTIL HFA;VENTOLIN HFA) 108 (90 BASE) MCG/ACT inhaler Inhale 2 puffs into the lungs every 6 (six) hours as needed for wheezing.  1 Inhaler  2  . aspirin 81 MG tablet Take 81 mg by mouth daily.        . budesonide-formoterol (SYMBICORT) 160-4.5 MCG/ACT inhaler Inhale 2 puffs into the lungs 2 (two) times daily.  1 Inhaler  1  . Calcium Carbonate-Vitamin D (CALCIUM 600 + D PO) Take 1 tablet by mouth daily.        . chlorpheniramine-HYDROcodone (TUSSIONEX PENNKINETIC ER) 10-8 MG/5ML LQCR Take 5 mLs by mouth every 12 (twelve) hours as needed (cough).  70 mL  0  . fluticasone (FLONASE) 50 MCG/ACT nasal spray Place 2 sprays into the nose daily.  16 g  5  . losartan (COZAAR) 100 MG  tablet TAKE 1 TABLET EVERY DAY  30 tablet  11  . metoprolol succinate (TOPROL-XL) 25 MG 24 hr tablet Take 1 tablet (25 mg total) by mouth daily.  90 tablet  3  . Multiple Vitamin (MULTIVITAMIN) tablet Take 1 tablet by mouth daily.        . nitroGLYCERIN (NITROSTAT) 0.4 MG SL tablet Place 0.4 mg under the tongue every 5 (five) minutes as needed.        . tiotropium (SPIRIVA) 18 MCG inhalation capsule Place 1 capsule (18 mcg total) into inhaler and inhale daily.  30 capsule  0    Allergies  Allergen Reactions  . Lipitor (Atorvastatin Calcium)     MUSCLE CRAMPS  . Neosporin (Neomycin-Polymyx-Gramicid)   . Prednisone   . Sulfa Antibiotics   . Penicillins Rash    Past Medical History  Diagnosis Date  . HTN (hypertension)   . Hypercholesteremia   . Ischemic heart disease     MI-2008  . Myocardial infarction 2008  . Breast lump     LEFT  . Abdominal fluid collection     likely seroma, evaluated by Interventional Radiology  . Hyponatremia   . Foot pain, left   . Otitis media      Left  . Laryngitis   . Coronary disease   . Dyspnea      Mild dyspnea  with left lung infiltrate and leukocytosis and left  posterior otitis media  . Pleural effusion   . COPD (chronic obstructive pulmonary disease)   . Advanced age     Past Surgical History  Procedure Date  . Splenectomy   . Hernia repair     History  Smoking status  . Never Smoker   Smokeless tobacco  . Never Used    History  Alcohol Use No    History reviewed. No pertinent family history.  Review of Systems: The review of systems is positive for dyspnea. Still wheezing.  All other systems were reviewed and are negative.  Physical Exam: BP 160/100  Pulse 62  Ht 5' (1.524 m)  Wt 148 lb (67.132 kg)  BMI 28.90 kg/m2  SpO2 98% Patient is very pleasant and in no acute distress. She looks younger than her stated age. Skin is warm and dry. Color is normal.  HEENT is unremarkable. Normocephalic/atraumatic. PERRL. Sclera  are nonicteric. Neck is supple. No masses. No JVD. Lungs have diffuse soft wheezing that do not clear with cough. Cardiac exam shows her heart tones to be distant.  Abdomen is soft. Extremities are without edema. Gait and ROM are intact. No gross neurologic deficits noted.   LABORATORY DATA: Repeat labs are pending.   Lab Results  Component Value Date   WBC 12.5* 01/04/2012   HGB 13.0 01/04/2012   HCT 38.6 01/04/2012   PLT 244 01/04/2012   GLUCOSE 106* 01/04/2012   CHOL 197 08/28/2011   TRIG 82.0 08/28/2011   HDL 72.40 08/28/2011   LDLDIRECT 114.7 04/03/2011   LDLCALC 108* 08/28/2011   ALT 16 08/28/2011   AST 24 08/28/2011   NA 128* 01/04/2012   K 4.1 01/04/2012   CL 97 01/04/2012   CREATININE 0.66 01/04/2012   BUN 12 01/04/2012   CO2 24 01/04/2012   TSH 2.775 01/01/2012   INR 1.07 12/20/2010   HGBA1C  Value: 6.0 (NOTE)                                                                       According to the ADA Clinical Practice Recommendations for 2011, when HbA1c is used as a screening test:   >=6.5%   Diagnostic of Diabetes Mellitus           (if abnormal result  is confirmed)  5.7-6.4%   Increased risk of developing Diabetes Mellitus  References:Diagnosis and Classification of Diabetes Mellitus,Diabetes Care,2011,34(Suppl 1):S62-S69 and Standards of Medical Care in         Diabetes - 2011,Diabetes Care,2011,34  (Suppl 1):S11-S61.* 12/17/2010   Dg Chest Port 1 View  01/01/2012  *RADIOLOGY REPORT*  Clinical Data: Cough.  PORTABLE CHEST - 1 VIEW  Comparison: 11/28/2011.  Findings: The heart is mildly enlarged but stable.  Stable tortuosity and calcification of the thoracic aorta.  Chronic lung changes but no acute overlying pulmonary process.  IMPRESSION: Mild stable cardiac enlargement and chronic bronchitic type lung changes but no acute pulmonary findings.  Original Report Authenticated By: P. Loralie Champagne, M.D.   Assessment / Plan:

## 2012-01-10 NOTE — Assessment & Plan Note (Signed)
We are rechecking her urine today. There was concern for a UTI while hospitalized. She denies symptoms.

## 2012-01-10 NOTE — Assessment & Plan Note (Signed)
Blood pressure is up. I did restart her Lasix back today. Will need to monitor her labs closely and we are rechecking her BMET today. May need Norvasc added but I was hesitant to add since she says she has a history of edema.

## 2012-01-10 NOTE — Assessment & Plan Note (Signed)
Patient presents with a recent URI/COPD exacerbation. Still with some wheezing on physical exam. She says she is a little stronger but is slow to recover. We need to recheck her labs today. I have put her back on her Lasix at her old dose (1/2 every other day). I think this will help with her blood pressure. We will need to follow her labs closely. We are rechecking her urine today to clarify this possible UTI while hospitalized. Pulmonary referral was recommended at hospitalization, so we will refer her today. She is to stay on her current medicines. Will see her back later this month as planned. Patient is agreeable to this plan and will call if any problems develop in the interim.

## 2012-01-11 ENCOUNTER — Other Ambulatory Visit: Payer: Self-pay

## 2012-01-11 DIAGNOSIS — I10 Essential (primary) hypertension: Secondary | ICD-10-CM

## 2012-01-11 MED ORDER — MOXIFLOXACIN HCL 400 MG PO TABS: 400.0000 mg | ORAL_TABLET | Freq: Every day | ORAL | Status: AC

## 2012-01-15 ENCOUNTER — Telehealth: Payer: Self-pay | Admitting: Cardiology

## 2012-01-15 NOTE — Telephone Encounter (Signed)
Does not need a repeat urine. It was normal.

## 2012-01-15 NOTE — Telephone Encounter (Signed)
OK to stop.

## 2012-01-15 NOTE — Telephone Encounter (Signed)
Left message

## 2012-01-15 NOTE — Telephone Encounter (Signed)
Have nausea in morning right after takes Avelox and having weakness, worse in am. These symptoms last for a few hours.  Head also feeling heavy and this is started after starting your medication.   Has had 5 of the 7 Avelox.  Will forward to [facility] NP for review

## 2012-01-15 NOTE — Telephone Encounter (Signed)
Pt calling re abx making her weak and nauseous , pls advise

## 2012-01-15 NOTE — Telephone Encounter (Signed)
Advised patient ok to stop.    Will double check with Lawson Fiscal on what she wanted rechecked on Thursday.  Chart indicated just BMET, patient thinks she is to have urine rechecked

## 2012-01-18 ENCOUNTER — Other Ambulatory Visit (INDEPENDENT_AMBULATORY_CARE_PROVIDER_SITE_OTHER): Payer: Medicare Other

## 2012-01-18 ENCOUNTER — Other Ambulatory Visit: Payer: Self-pay | Admitting: Cardiology

## 2012-01-18 DIAGNOSIS — I10 Essential (primary) hypertension: Secondary | ICD-10-CM

## 2012-01-18 LAB — BASIC METABOLIC PANEL
BUN: 17 mg/dL (ref 6–23)
CO2: 25 mEq/L (ref 19–32)
Calcium: 9.1 mg/dL (ref 8.4–10.5)
Chloride: 97 mEq/L (ref 96–112)
Creatinine, Ser: 0.8 mg/dL (ref 0.4–1.2)
GFR: 75.31 mL/min (ref >60.00)
Glucose, Bld: 101 mg/dL — ABNORMAL HIGH (ref 70–99)
Potassium: 4.3 mEq/L (ref 3.5–5.1)
Sodium: 130 mEq/L — ABNORMAL LOW (ref 135–145)

## 2012-01-18 NOTE — Telephone Encounter (Signed)
Refilled losartan 

## 2012-01-19 ENCOUNTER — Telehealth: Payer: Self-pay | Admitting: *Deleted

## 2012-01-19 DIAGNOSIS — E871 Hypo-osmolality and hyponatremia: Secondary | ICD-10-CM

## 2012-01-19 NOTE — Telephone Encounter (Signed)
Message copied by Barrie Folk on Fri Jan 19, 2012  5:45 PM ------      Message from: Rosalio Macadamia      Created: Fri Jan 19, 2012  7:38 AM       Please clarify her Lasix dose. Have her cut in half. Recheck BMET in about 10 days. Needs to cut back on her water intake.

## 2012-01-19 NOTE — Telephone Encounter (Signed)
Message copied by Barrie Folk on Fri Jan 19, 2012  5:46 PM ------      Message from: Rosalio Macadamia      Created: Fri Jan 19, 2012  7:38 AM       Please clarify her Lasix dose. Have her cut in half. Recheck BMET in about 10 days. Needs to cut back on her water intake.

## 2012-01-19 NOTE — Telephone Encounter (Signed)
Pt aware of lab results. Please see lab note. Pt takes 1/2 of a 40mg  lasix every other day. She will come in next Thursday for repeat BMP Mylo Red RN

## 2012-01-25 ENCOUNTER — Other Ambulatory Visit (INDEPENDENT_AMBULATORY_CARE_PROVIDER_SITE_OTHER): Payer: Medicare Other

## 2012-01-25 DIAGNOSIS — E871 Hypo-osmolality and hyponatremia: Secondary | ICD-10-CM

## 2012-01-25 LAB — BASIC METABOLIC PANEL
BUN: 14 mg/dL (ref 6–23)
CO2: 26 mEq/L (ref 19–32)
Calcium: 9.4 mg/dL (ref 8.4–10.5)
Chloride: 97 mEq/L (ref 96–112)
Creatinine, Ser: 0.8 mg/dL (ref 0.4–1.2)
GFR: 74.18 mL/min (ref >60.00)
Glucose, Bld: 95 mg/dL (ref 70–99)
Potassium: 4.3 mEq/L (ref 3.5–5.1)
Sodium: 131 mEq/L — ABNORMAL LOW (ref 135–145)

## 2012-01-26 ENCOUNTER — Telehealth: Payer: Self-pay | Admitting: *Deleted

## 2012-01-26 NOTE — Telephone Encounter (Signed)
Advised of labs 

## 2012-01-26 NOTE — Telephone Encounter (Signed)
Message copied by Burnell Blanks on Fri Jan 26, 2012  2:52 PM ------      Message from: Cassell Clement      Created: Thu Jan 25, 2012  5:38 PM       Blood tests are better.  Continue present meds.

## 2012-02-05 ENCOUNTER — Ambulatory Visit (INDEPENDENT_AMBULATORY_CARE_PROVIDER_SITE_OTHER): Payer: Medicare Other | Admitting: Cardiology

## 2012-02-05 ENCOUNTER — Encounter: Payer: Self-pay | Admitting: Cardiology

## 2012-02-05 VITALS — BP 130/80 | HR 76 | Ht 60.0 in | Wt 144.0 lb

## 2012-02-05 DIAGNOSIS — I119 Hypertensive heart disease without heart failure: Secondary | ICD-10-CM

## 2012-02-05 DIAGNOSIS — I4891 Unspecified atrial fibrillation: Secondary | ICD-10-CM | POA: Insufficient documentation

## 2012-02-05 DIAGNOSIS — R0600 Dyspnea, unspecified: Secondary | ICD-10-CM

## 2012-02-05 DIAGNOSIS — R0609 Other forms of dyspnea: Secondary | ICD-10-CM

## 2012-02-05 LAB — BASIC METABOLIC PANEL
CO2: 27 mEq/L (ref 19–32)
Calcium: 9.2 mg/dL (ref 8.4–10.5)
Creatinine, Ser: 0.8 mg/dL (ref 0.4–1.2)
Sodium: 136 mEq/L (ref 135–145)

## 2012-02-05 NOTE — Assessment & Plan Note (Signed)
Blood pressure today is stable.  She has been taking 10 mg of furosemide every other day.  We drew a basal metabolic panel today which showed resolution of her hyponatremia and her serum sodium is now 136.  She will continue same low dose diuretic regimen

## 2012-02-05 NOTE — Progress Notes (Signed)
Quick Note:  Please report to patient. The recent labs are stable. Continue same medication and careful diet. The serum sodium is back up to normal. ______

## 2012-02-05 NOTE — Progress Notes (Signed)
[name] Date of Birth:  01-04-1916 [facility] [address] [place], [place]  [address] [phone]         Fax   [phone]  History of Present Illness: This pleasant 78 year old woman is seen for a scheduled followup office visit.  Is a past history of angina hypertension and a history of previous hyponatremia.  He has a questionable history of COPD.  His last seen in our office on 01/10/12.  No EKG was done at that point.  Looking back at her hospital records she did have EKGs on 01/01/12 and 01/02/12 which did show atrial fibrillation with a controlled ventricular response.  She is not a good Coumadin candidate because of her age and fall risk.  She has a chadss score of 2 for high blood pressure and age.   Current Outpatient Prescriptions  Medication Sig Dispense Refill  . albuterol (PROVENTIL HFA;VENTOLIN HFA) 108 (90 BASE) MCG/ACT inhaler Inhale 2 puffs into the lungs every 6 (six) hours as needed for wheezing.  1 Inhaler  2  . aspirin 81 MG tablet Take 81 mg by mouth daily.        . budesonide-formoterol (SYMBICORT) 160-4.5 MCG/ACT inhaler Inhale 2 puffs into the lungs 2 (two) times daily.  1 Inhaler  1  . Calcium Carbonate-Vitamin D (CALCIUM 600 + D PO) Take 1 tablet by mouth daily.        . chlorpheniramine-HYDROcodone (TUSSIONEX PENNKINETIC ER) 10-8 MG/5ML LQCR Take 5 mLs by mouth every 12 (twelve) hours as needed (cough).  70 mL  0  . fluticasone (FLONASE) 50 MCG/ACT nasal spray Place 2 sprays into the nose daily.  16 g  5  . furosemide (LASIX) 40 MG tablet Take 40 mg by mouth. 1/4 tablet every other day      . losartan (COZAAR) 100 MG tablet TAKE 1 TABLET EVERY DAY  30 tablet  6  . metoprolol succinate (TOPROL-XL) 25 MG 24 hr tablet Take 1 tablet (25 mg total) by mouth daily.  90 tablet  3  . Multiple Vitamin (MULTIVITAMIN) tablet Take 1 tablet by mouth daily.        . nitroGLYCERIN (NITROSTAT) 0.4 MG SL tablet Place 0.4 mg under the tongue every 5  (five) minutes as needed.        . tiotropium (SPIRIVA) 18 MCG inhalation capsule Place 1 capsule (18 mcg total) into inhaler and inhale daily.  30 capsule  0    Allergies  Allergen Reactions  . Lipitor (Atorvastatin Calcium)     MUSCLE CRAMPS  . Neosporin (Neomycin-Polymyx-Gramicid)   . Prednisone   . Sulfa Antibiotics   . Penicillins Rash    Patient Active Problem List  Diagnoses  . Hyponatremia  . Hypertension  . Hypercholesterolemia  . Status post splenectomy  . Benign hypertensive heart disease without heart failure  . Ischemic heart disease  . Urinary tract infection  . Dyspnea  . Acute URI  . Atrial fibrillation    History  Smoking status  . Never Smoker   Smokeless tobacco  . Never Used    History  Alcohol Use No    No family history on file.  Review of Systems: Constitutional: no fever chills diaphoresis or fatigue or change in weight.  Head and neck: no hearing loss, no epistaxis, no photophobia or visual disturbance. Respiratory: No cough, shortness of breath or wheezing. Cardiovascular: No chest pain peripheral edema, palpitations. Gastrointestinal: No abdominal distention, no abdominal pain, no change in  bowel habits hematochezia or melena. Genitourinary: No dysuria, no frequency, no urgency, no nocturia. Musculoskeletal:No arthralgias, no back pain, no gait disturbance or myalgias. Neurological: No dizziness, no headaches, no numbness, no seizures, no syncope, no weakness, no tremors. Hematologic: No lymphadenopathy, no easy bruising. Psychiatric: No confusion, no hallucinations, no sleep disturbance.    Physical Exam: Filed Vitals:   02/05/12 0940  BP: 130/80  Pulse: 76   the general appearance reveals a alert elderly woman in no distress.Pupils equal and reactive.   Extraocular Movements are full.  There is no scleral icterus.  The mouth and pharynx are normal.  The neck is supple.  The carotids reveal no bruits.  The jugular venous pressure  is normal.  The thyroid is not enlarged.  There is no lymphadenopathy.  The chest is clear to percussion and auscultation. There are no rales or rhonchi. Expansion of the chest is symmetrical.  The precordium is quiet.  The first heart sound is normal.  The second heart sound is physiologically split.  There is no murmur gallop rub or click.  There is no abnormal lift or heave.  The rhythm is slightly irregular.The abdomen is soft and nontender. Bowel sounds are normal. The liver and spleen are not enlarged. There Are no abdominal masses. There are no bruits.  The pedal pulses are good.  There is no phlebitis or edema.  There is no cyanosis or clubbing. Strength is normal and symmetrical in all extremities.  There is no lateralizing weakness.  There are no sensory deficits.       Assessment / Plan: Continue same medication.  Recheck in 3 months for followup office visit and EKG.  We ordered an echocardiogram to evaluate her atrial fibrillation further

## 2012-02-05 NOTE — Assessment & Plan Note (Signed)
The patient was told in the hospital that she had COPD.  She has never smoked although she did have secondhand smoke from her husband.  She was told that a pulmonary consultation would be set up for her.  This has not yet occurred.  She is leaving town in 2 days for an extended vacation including a trip to [place] to visit her family.  When she returns to [facility] she will call us and we will get her set up with a pulmonary specialist.  She does not think that she has COPD and she has not been using her inhalers on a regular basis.

## 2012-02-05 NOTE — Assessment & Plan Note (Signed)
Her electrocardiogram today shows atrial fibrillation with a controlled ventricular response.  This is a new diagnosis for her although her EKGs in March also showed atrial fibrillation in the hospital.  We will continue aspirin for anticoagulation.  She does not need any additional rate control.  He has not been expressing any TIA symptoms.  He has not had a recent echocardiogram and we will arrange for this soon.

## 2012-02-05 NOTE — Patient Instructions (Addendum)
Will obtain labs today and call you with the results (BMET)  Your physician has requested that you have an echocardiogram. Echocardiography is a painless test that uses sound waves to create images of your heart. It provides your doctor with information about the size and shape of your heart and how well your heart's chambers and valves are working. This procedure takes approximately one hour. There are no restrictions for this procedure. NEEDS ASAP (TODAY OR TOMORROW IF POSSIBLE)  Your physician recommends that you continue on your current medications as directed. Please refer to the Current Medication list given to you today.  Your physician recommends that you schedule a follow-up appointment in: 3 months and EKG  Call us when you get back from vacation and will schedule an appointment with pulmonary doctor

## 2012-02-06 ENCOUNTER — Telehealth: Payer: Self-pay | Admitting: *Deleted

## 2012-02-06 NOTE — Telephone Encounter (Signed)
Advised patient of labs 

## 2012-02-06 NOTE — Telephone Encounter (Signed)
Message copied by Burnell Blanks on Tue Feb 06, 2012  1:08 PM ------      Message from: Cassell Clement      Created: Mon Feb 05, 2012 12:43 PM       Please report to patient.  The recent labs are stable. Continue same medication and careful diet.  The serum sodium is back up to normal.

## 2012-02-07 ENCOUNTER — Other Ambulatory Visit: Payer: Self-pay

## 2012-02-07 ENCOUNTER — Ambulatory Visit (HOSPITAL_COMMUNITY): Payer: Medicare Other | Attending: Cardiology

## 2012-02-07 DIAGNOSIS — I4891 Unspecified atrial fibrillation: Secondary | ICD-10-CM | POA: Insufficient documentation

## 2012-02-07 DIAGNOSIS — J4489 Other specified chronic obstructive pulmonary disease: Secondary | ICD-10-CM | POA: Insufficient documentation

## 2012-02-07 DIAGNOSIS — R0989 Other specified symptoms and signs involving the circulatory and respiratory systems: Secondary | ICD-10-CM | POA: Insufficient documentation

## 2012-02-07 DIAGNOSIS — I059 Rheumatic mitral valve disease, unspecified: Secondary | ICD-10-CM | POA: Insufficient documentation

## 2012-02-07 DIAGNOSIS — I517 Cardiomegaly: Secondary | ICD-10-CM | POA: Insufficient documentation

## 2012-02-07 DIAGNOSIS — R0609 Other forms of dyspnea: Secondary | ICD-10-CM | POA: Insufficient documentation

## 2012-02-07 DIAGNOSIS — J449 Chronic obstructive pulmonary disease, unspecified: Secondary | ICD-10-CM | POA: Insufficient documentation

## 2012-02-07 DIAGNOSIS — I359 Nonrheumatic aortic valve disorder, unspecified: Secondary | ICD-10-CM | POA: Insufficient documentation

## 2012-02-07 DIAGNOSIS — E785 Hyperlipidemia, unspecified: Secondary | ICD-10-CM | POA: Insufficient documentation

## 2012-02-07 DIAGNOSIS — I10 Essential (primary) hypertension: Secondary | ICD-10-CM | POA: Insufficient documentation

## 2012-02-12 ENCOUNTER — Telehealth: Payer: Self-pay | Admitting: Cardiology

## 2012-02-12 NOTE — Telephone Encounter (Signed)
Follow- up: ° ° °Patient returned your phone call. Please call back. °

## 2012-02-12 NOTE — Telephone Encounter (Signed)
Left message

## 2012-02-12 NOTE — Telephone Encounter (Signed)
New msg °Pt wants to know echo results Please call °

## 2012-02-12 NOTE — Telephone Encounter (Signed)
Advised of echo  At [facility] and feet (right worse than left) are swelling.  Taking 1/4 of a Lasix.  Is avoiding salt and keeping elevated.  Will forward to  Dr. Patty Sermons

## 2012-02-12 NOTE — Telephone Encounter (Signed)
Message copied by Burnell Blanks on Mon Feb 12, 2012 12:11 PM ------      Message from: Cassell Clement      Created: Thu Feb 08, 2012 10:41 PM       Please report.  LV function is good. Mitral valve is leaking and we are treating that with medicines(losartan).  CSD

## 2012-02-12 NOTE — Telephone Encounter (Signed)
Keep a voiding salt and she can increase her Lasix if necessary up to a half tablet daily when necessary for swelling

## 2012-02-12 NOTE — Telephone Encounter (Signed)
Advised ok to increase as needed

## 2012-03-18 ENCOUNTER — Encounter: Payer: Self-pay | Admitting: Cardiology

## 2012-03-18 ENCOUNTER — Ambulatory Visit (INDEPENDENT_AMBULATORY_CARE_PROVIDER_SITE_OTHER): Payer: Medicare Other | Admitting: Cardiology

## 2012-03-18 VITALS — BP 120/80 | HR 60 | Ht 60.0 in | Wt 147.0 lb

## 2012-03-18 DIAGNOSIS — I10 Essential (primary) hypertension: Secondary | ICD-10-CM

## 2012-03-18 DIAGNOSIS — I259 Chronic ischemic heart disease, unspecified: Secondary | ICD-10-CM

## 2012-03-18 DIAGNOSIS — E871 Hypo-osmolality and hyponatremia: Secondary | ICD-10-CM

## 2012-03-18 DIAGNOSIS — I119 Hypertensive heart disease without heart failure: Secondary | ICD-10-CM

## 2012-03-18 DIAGNOSIS — I4891 Unspecified atrial fibrillation: Secondary | ICD-10-CM

## 2012-03-18 NOTE — Assessment & Plan Note (Signed)
Her blood pressure was remaining stable on current therapy 

## 2012-03-18 NOTE — Progress Notes (Signed)
[name] Date of Birth:  Jul 14, 1916 [facility] [address] [place], [place]  [address] [phone]  Fax   [phone]  HPI: This pleasant 78 year old woman is seen for a scheduled followup office visit.  This patient has a history of angina pectoris, hypertension, and prior hyponatremia.  She has a history of atrial fibrillation with a controlled ventricular response.  She is not on Coumadin because of her age and she is a fall risk.  She has a chadds score of 2 for high blood pressure and age.  She is getting ready to move to [place] to be near 2 of her daughters.  [place] [facility] [place] [facility] [place].  When she gets there she will inquire about physicians in the area and then she will contact us to forward some of her records.  Current Outpatient Prescriptions  Medication Sig Dispense Refill  . aspirin 81 MG tablet Take 81 mg by mouth daily.        . Calcium Carbonate-Vitamin D (CALCIUM 600 + D PO) Take 1 tablet by mouth daily.        . chlorpheniramine-HYDROcodone (TUSSIONEX PENNKINETIC ER) 10-8 MG/5ML LQCR Take 5 mLs by mouth every 12 (twelve) hours as needed (cough).  70 mL  0  . fluticasone (FLONASE) 50 MCG/ACT nasal spray Place 2 sprays into the nose daily.  16 g  5  . furosemide (LASIX) 40 MG tablet Take 40 mg by mouth. 1/2 tablet every other day      . losartan (COZAAR) 100 MG tablet TAKE 1 TABLET EVERY DAY  30 tablet  6  . metoprolol succinate (TOPROL-XL) 25 MG 24 hr tablet Take 1 tablet (25 mg total) by mouth daily.  90 tablet  3  . Multiple Vitamin (MULTIVITAMIN) tablet Take 1 tablet by mouth daily.        . nitroGLYCERIN (NITROSTAT) 0.4 MG SL tablet Place 0.4 mg under the tongue every 5 (five) minutes as needed.          Allergies  Allergen Reactions  . Lipitor (Atorvastatin Calcium)     MUSCLE CRAMPS  . Neosporin (Neomycin-Polymyxin-Gramicidin)   . Prednisone   . Sulfa Antibiotics   . Penicillins Rash     Patient Active Problem List  Diagnoses  . Hyponatremia  . Hypertension  . Hypercholesterolemia  . Status post splenectomy  . Benign hypertensive heart disease without heart failure  . Ischemic heart disease  . Urinary tract infection  . Dyspnea  . Acute URI  . [place]ath-induced atrial fibrillation    History  Smoking status  . Never Smoker   Smokeless tobacco  . Never Used    History  Alcohol Use No    No family history on file.  Review of Systems: The patient denies any heat or cold intolerance.  No weight gain or weight loss.  The patient denies headaches or blurry vision.  There is no cough or sputum production.  The patient denies dizziness.  There is no hematuria or hematochezia.  The patient denies any muscle aches or arthritis.  The patient denies any rash.  The patient denies frequent falling or instability.  There is no history of depression or anxiety.  All other systems were reviewed and are negative.   Physical Exam: Filed Vitals:   03/18/12 1604  BP: 120/80  Pulse: 60   the general appearance reveals a healthy-appearing elderly woman in no distress.The head and neck exam reveals  pupils equal and reactive.  Extraocular movements are full.  There is no scleral icterus.  The mouth and pharynx are normal.  The neck is supple.  The carotids reveal no bruits.  The jugular venous pressure is normal.  The  thyroid is not enlarged.  There is no lymphadenopathy.  The chest is clear to percussion and auscultation.  There are no rales or rhonchi.  Expansion of the chest is symmetrical.  The precordium is quiet.  The first heart sound is normal.  The second heart sound is physiologically split.  There is no murmur gallop rub or click.  There is no abnormal lift or heave.  The abdomen is soft and nontender.  The bowel sounds are normal.  The liver and spleen are not enlarged.  There are no abdominal masses.  There are no abdominal bruits.  Extremities reveal good pedal pulses.   There is no phlebitis and  there is mild edema.  There is no cyanosis or clubbing.  Strength is normal and symmetrical in all extremities.  There is no lateralizing weakness.  There are no sensory deficits.  The skin is warm and dry.  There is no rash.   EKG today shows atrial fibrillation with right bundle branch block.  No acute changes.  Ventricular response is controlled at 64 per minute  Assessment / Plan: Continue same medication.  Recheck when necessary if she is back in the [place] area.

## 2012-03-18 NOTE — Assessment & Plan Note (Signed)
She has not been having any chest pain or angina pectoris

## 2012-03-18 NOTE — Patient Instructions (Signed)
Will obtain labs today and call you with the results  Your physician recommends that you continue on your current medications as directed. Please refer to the Current Medication list given to you today.  Follow up as needed

## 2012-03-18 NOTE — Assessment & Plan Note (Signed)
Patient has a past history of hyponatremia and so we are limiting her diuretic to just one half tablet every other day of furosemide

## 2012-03-19 LAB — BASIC METABOLIC PANEL
BUN: 20 mg/dL (ref 6–23)
Chloride: 101 mEq/L (ref 96–112)
GFR: 56.81 mL/min — ABNORMAL LOW (ref >60.00)
Potassium: 4.7 mEq/L (ref 3.5–5.1)
Sodium: 135 mEq/L (ref 135–145)

## 2012-03-19 NOTE — Progress Notes (Signed)
Quick Note:  Please report to patient. The recent labs are stable. Continue same medication and careful diet. ______ 

## 2012-03-20 ENCOUNTER — Telehealth: Payer: Self-pay | Admitting: *Deleted

## 2012-03-20 NOTE — Telephone Encounter (Signed)
Advised patient of lab results  

## 2012-03-20 NOTE — Telephone Encounter (Signed)
Message copied by Burnell Blanks on Wed Mar 20, 2012  3:23 PM ------      Message from: [name]      Created: Tue Mar 19, 2012  9:45 PM       Please report to patient.  The recent labs are stable. Continue same medication and careful diet.

## 2012-04-15 ENCOUNTER — Telehealth: Payer: Self-pay | Admitting: Cardiology

## 2012-04-15 NOTE — Telephone Encounter (Signed)
I told her to avoid decongestants.

## 2012-04-15 NOTE — Telephone Encounter (Signed)
New msg Pt's daughter in law wants to know which type of mucinex she can take for cold.congestion. Please call

## 2012-05-13 ENCOUNTER — Ambulatory Visit: Payer: Medicare Other | Admitting: Cardiology

## 2012-05-16 ENCOUNTER — Telehealth: Payer: Self-pay | Admitting: Cardiology

## 2012-09-18 IMAGING — CR DG CHEST 2V
2 series · 2 of 2 positions shown · non-contrast
Comparison: 12/18/2006

CLINICAL DATA: Short of breath

CHEST - 2 VIEW

[w chest pa]
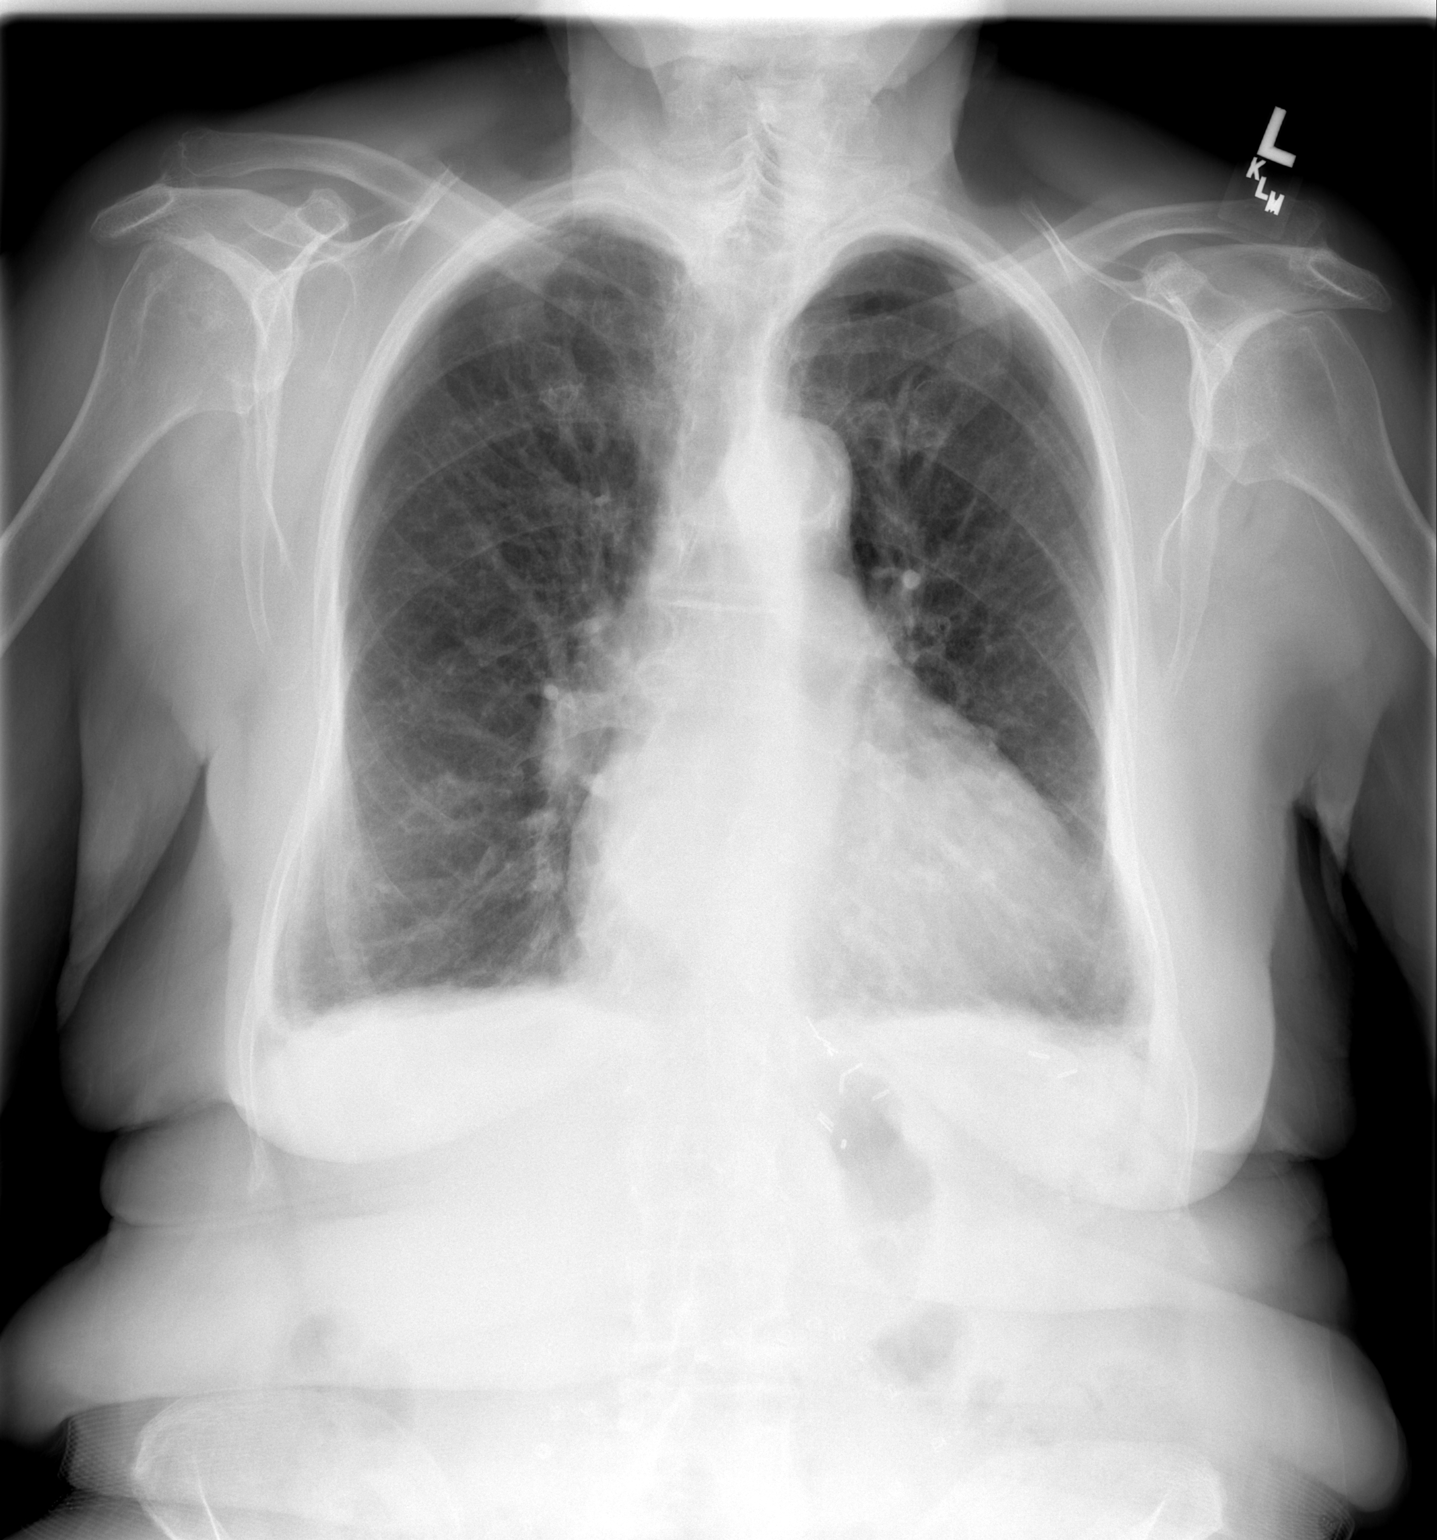

[w chest lat]
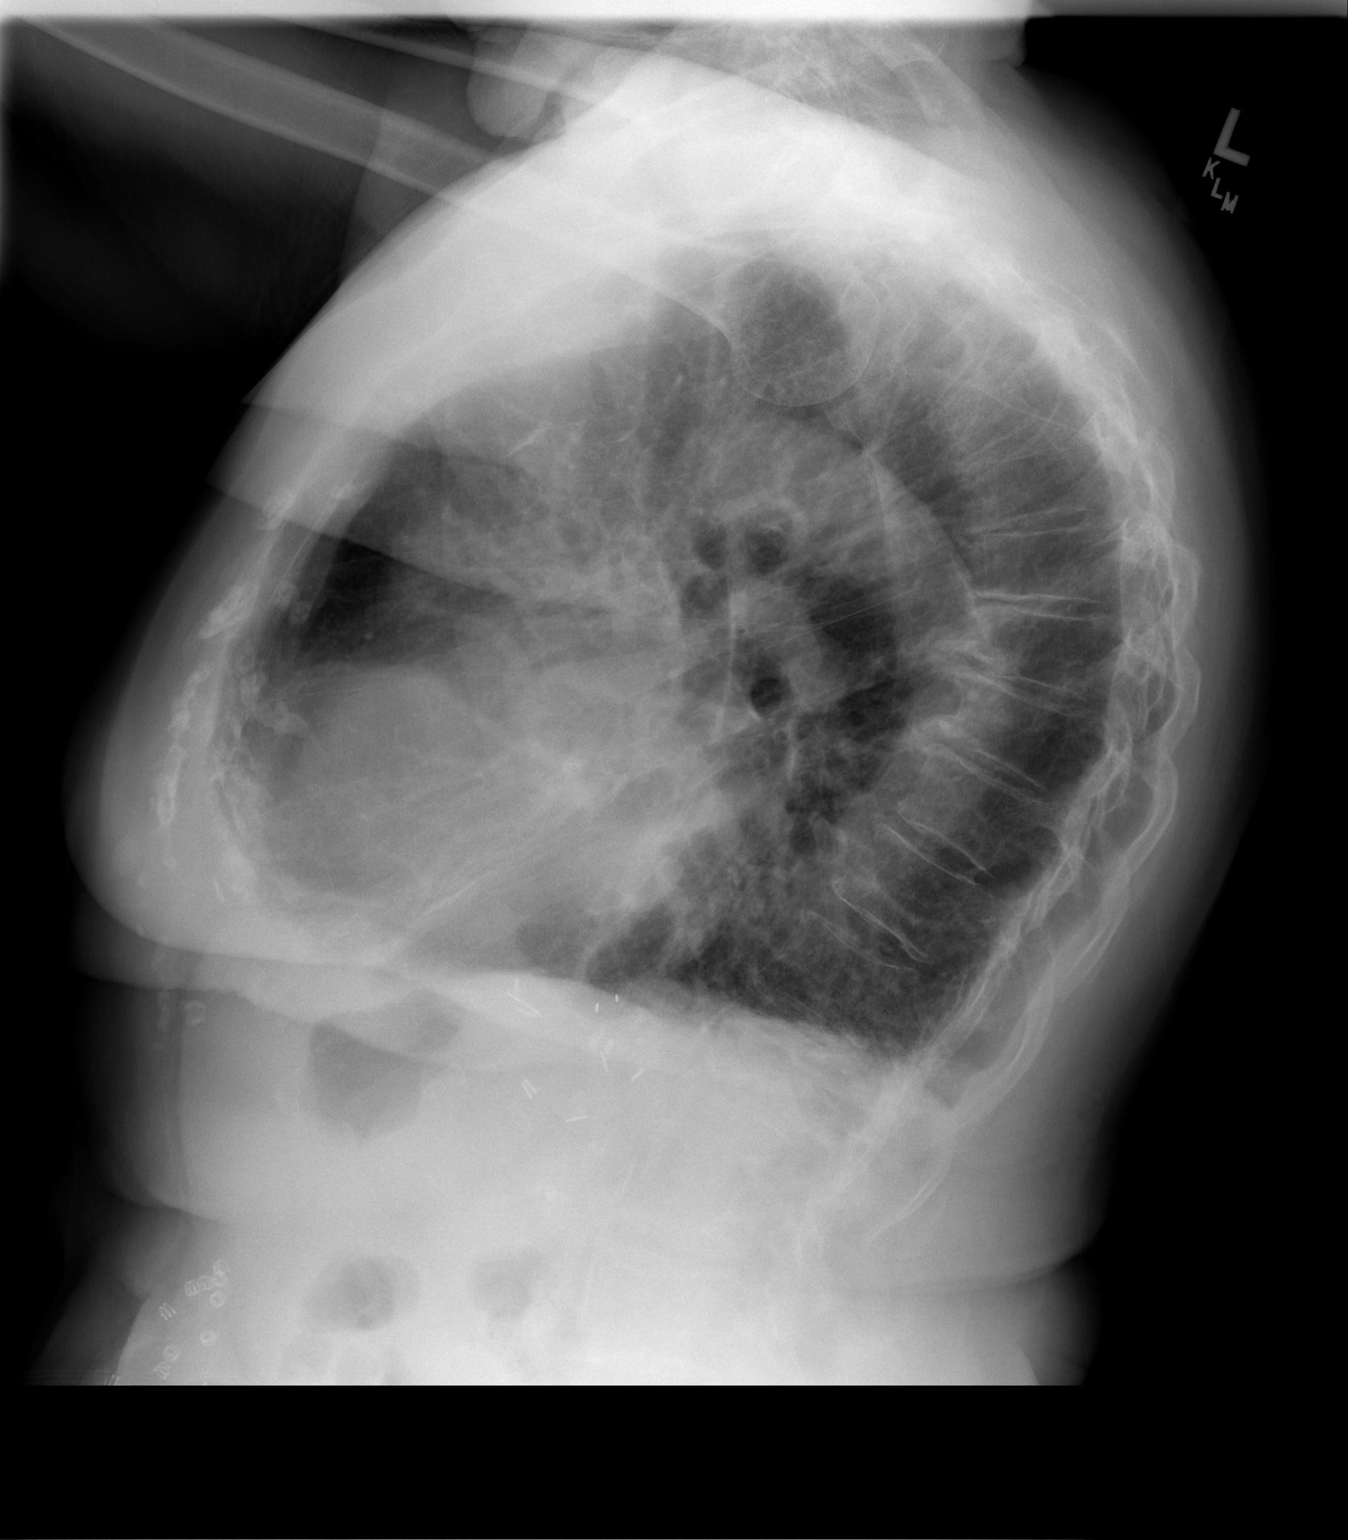

[2 of 2 positions shown; findings below may reference images not displayed]

FINDINGS: COPD with pulmonary hyperinflation.  Cardiac enlargement
without heart failure.  Small pleural effusions have improved.
Mild bibasilar atelectasis has improved.  Negative for pneumonia or
mass.
IMPRESSION: COPD.  Mild bibasilar atelectasis or scarring and minimal pleural
effusion, improved from the prior study.

## 2012-10-22 IMAGING — CR DG CHEST 1V PORT
1 series · 1 of 1 positions shown · non-contrast
Comparison: 11/28/2011.

CLINICAL DATA: Cough.

PORTABLE CHEST - 1 VIEW

[AP]
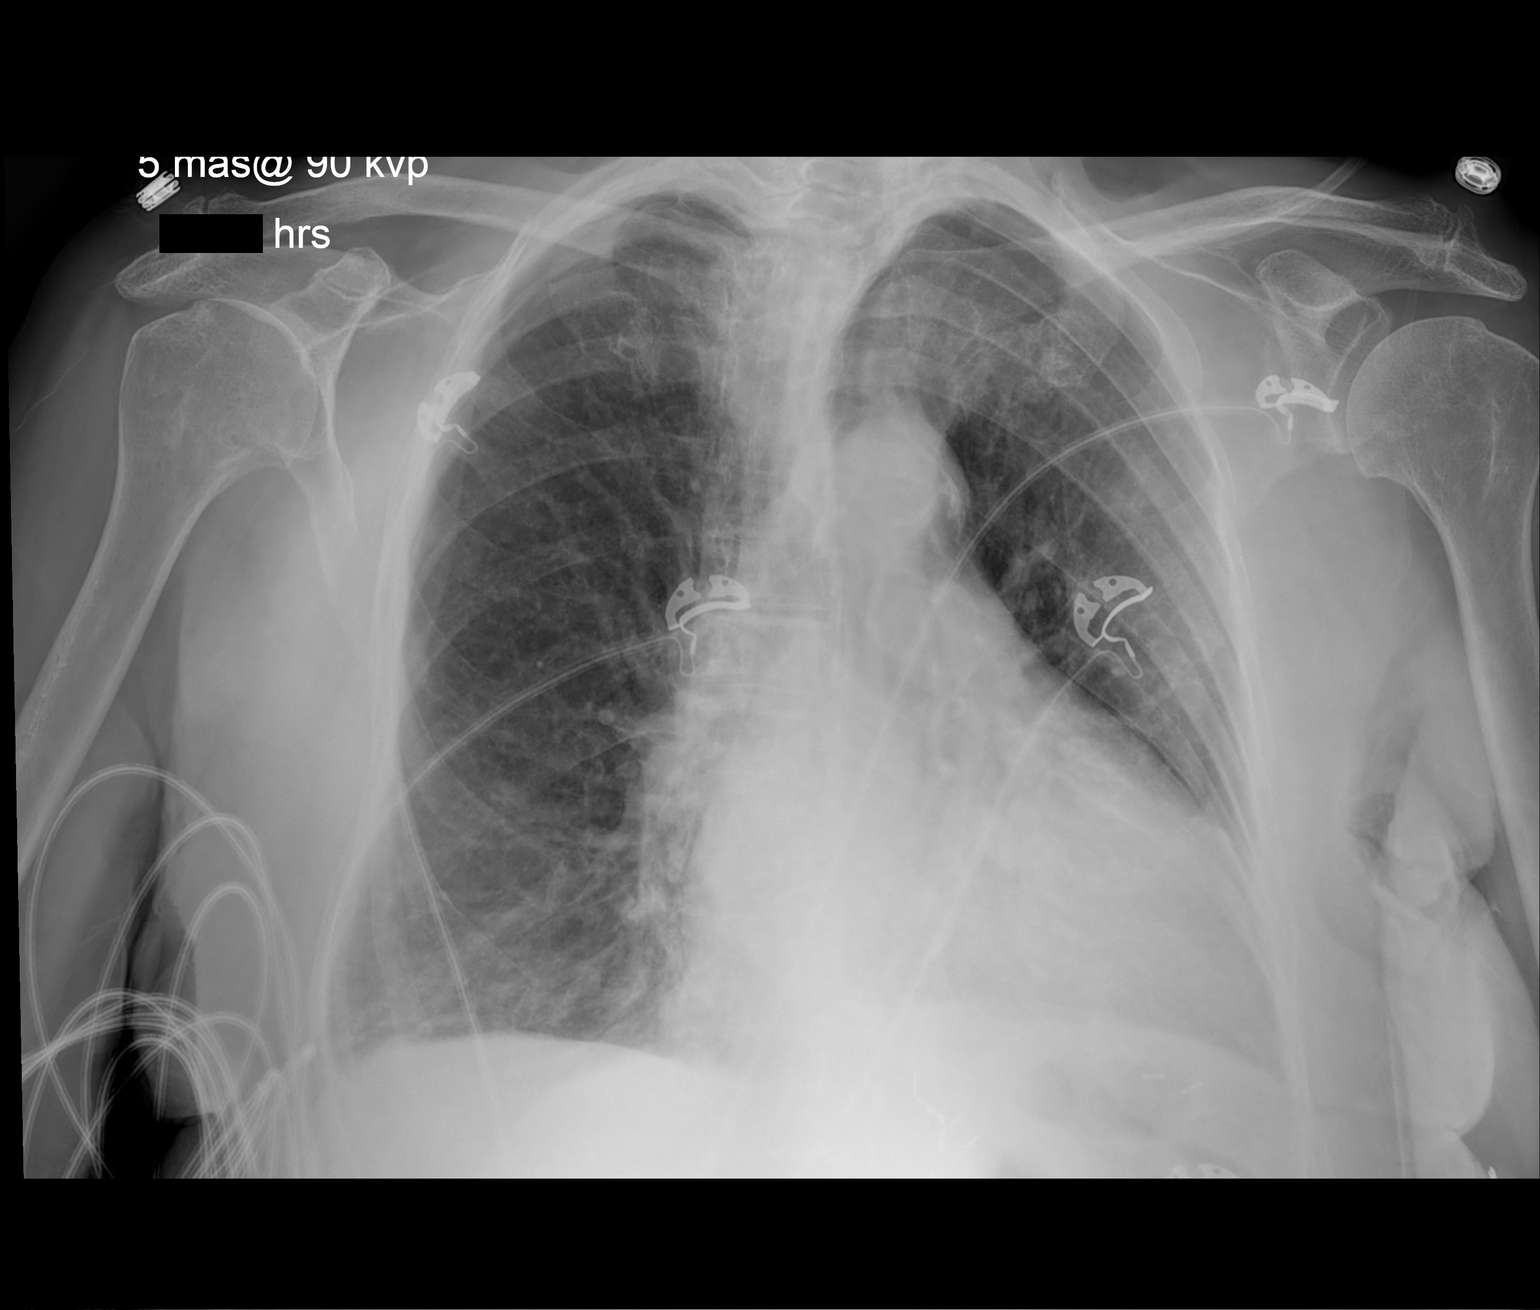

[1 of 1 positions shown; findings below may reference images not displayed]

FINDINGS: The heart is mildly enlarged but stable.  Stable
tortuosity and calcification of the thoracic aorta.  Chronic lung
changes but no acute overlying pulmonary process.
IMPRESSION: Mild stable cardiac enlargement and chronic bronchitic type lung
changes but no acute pulmonary findings.

## 2013-02-13 ENCOUNTER — Encounter: Payer: Self-pay | Admitting: Cardiology

## 2014-10-09 DEATH — deceased
